# Patient Record
Sex: Female | Born: 1995
Health system: Southern US, Community
[De-identification: ages and names within clinical notes are randomized; demographics above are authoritative.]

## PROBLEM LIST (undated history)

## (undated) DIAGNOSIS — Z5189 Encounter for other specified aftercare: Secondary | ICD-10-CM

## (undated) DIAGNOSIS — R87629 Unspecified abnormal cytological findings in specimens from vagina: Secondary | ICD-10-CM

---

## 2017-06-18 ENCOUNTER — Encounter (HOSPITAL_BASED_OUTPATIENT_CLINIC_OR_DEPARTMENT_OTHER): Payer: Self-pay

## 2017-06-18 ENCOUNTER — Emergency Department (HOSPITAL_BASED_OUTPATIENT_CLINIC_OR_DEPARTMENT_OTHER)
Admission: EM | Admit: 2017-06-18 | Discharge: 2017-06-18 | Disposition: A | Discharge status: Home / Self Care | Payer: Self-pay | Attending: Emergency Medicine | Admitting: Emergency Medicine

## 2017-06-18 DIAGNOSIS — B9689 Other specified bacterial agents as the cause of diseases classified elsewhere: Secondary | ICD-10-CM

## 2017-06-18 DIAGNOSIS — N898 Other specified noninflammatory disorders of vagina: Secondary | ICD-10-CM

## 2017-06-18 DIAGNOSIS — N760 Acute vaginitis: Secondary | ICD-10-CM | POA: Insufficient documentation

## 2017-06-18 DIAGNOSIS — R1033 Periumbilical pain: Secondary | ICD-10-CM | POA: Insufficient documentation

## 2017-06-18 LAB — URINALYSIS, ROUTINE W REFLEX MICROSCOPIC
BILIRUBIN URINE: NEGATIVE
Glucose, UA: NEGATIVE mg/dL
Hgb urine dipstick: NEGATIVE
Ketones, ur: 15 mg/dL — AB
NITRITE: NEGATIVE
Protein, ur: NEGATIVE mg/dL
SPECIFIC GRAVITY, URINE: 1.028 (ref 1.005–1.030)
pH: 6.5 (ref 5.0–8.0)

## 2017-06-18 LAB — WET PREP, GENITAL
Sperm: NONE SEEN
Trich, Wet Prep: NONE SEEN
YEAST WET PREP: NONE SEEN

## 2017-06-18 LAB — COMPREHENSIVE METABOLIC PANEL
ALT: 11 U/L — ABNORMAL LOW (ref 14–54)
AST: 16 U/L (ref 15–41)
Albumin: 4.2 g/dL (ref 3.5–5.0)
Alkaline Phosphatase: 61 U/L (ref 38–126)
Anion gap: 7 (ref 5–15)
BILIRUBIN TOTAL: 0.3 mg/dL (ref 0.3–1.2)
BUN: 14 mg/dL (ref 6–20)
CO2: 26 mmol/L (ref 22–32)
CREATININE: 0.84 mg/dL (ref 0.44–1.00)
Calcium: 9.4 mg/dL (ref 8.9–10.3)
Chloride: 106 mmol/L (ref 101–111)
GFR calc Af Amer: >60 mL/min (ref >60)
Glucose, Bld: 89 mg/dL (ref 65–99)
POTASSIUM: 4.2 mmol/L (ref 3.5–5.1)
Sodium: 139 mmol/L (ref 135–145)
Total Protein: 8.6 g/dL — ABNORMAL HIGH (ref 6.5–8.1)

## 2017-06-18 LAB — URINALYSIS, MICROSCOPIC (REFLEX)

## 2017-06-18 LAB — PREGNANCY, URINE: PREG TEST UR: NEGATIVE

## 2017-06-18 MED ORDER — CEFTRIAXONE SODIUM 250 MG IJ SOLR
250.0000 mg | Freq: Once | INTRAMUSCULAR | Status: AC
Start: 2017-06-18 — End: 2017-06-18
  Administered 2017-06-18: 250 mg via INTRAMUSCULAR
  Filled 2017-06-18: qty 250

## 2017-06-18 MED ORDER — METRONIDAZOLE 500 MG PO TABS: 500.0000 mg | ORAL_TABLET | Freq: Two times a day (BID) | ORAL | 0 refills | Status: DC

## 2017-06-18 MED ORDER — AZITHROMYCIN 250 MG PO TABS
1000.0000 mg | ORAL_TABLET | Freq: Once | ORAL | Status: AC
  Administered 2017-06-18: 1000 mg via ORAL
  Filled 2017-06-18: qty 4

## 2017-06-18 NOTE — ED Triage Notes (Signed)
C/o abd pain x 2 days-denies n/v/d and vaginal d/c-pos urinary freq-NAD-steady gait

## 2017-06-18 NOTE — ED Provider Notes (Signed)
[facility] Provider Note   CSN: [id number] Arrival date & time: 06/18/17  1524     History   Chief Complaint Chief Complaint  Patient presents with  . Abdominal Pain    HPI Taylor Chang is a 21 y.o. female who presents to the emergency department with intermittent, sharp periumbilical pain that began 2 days ago. She reports associated urinary frequency that began within the last week and and vaginal discharge that has been ongoing for sometime. She denies nausea, vomiting, or diarrhea. No fever or chills. She reports that her son is in daycare and has had diarrhea for the last 2 days. No other sick contacts.  She reports that she was treated 2 months ago for chlamydia. She states that she is still currently active with the same female sexual partner, but states he was also treated at that time. She is uncertain if their relationship is monogamous. No pertinent past medical history. No daily medications.  The history is provided by the patient. No language interpreter was used.    History reviewed. No pertinent past medical history.  There are no active problems to display for this patient.   History reviewed. No pertinent surgical history.  OB History    No data available       Home Medications    Prior to Admission medications   Medication Sig Start Date End Date Taking? Authorizing Provider  metroNIDAZOLE (FLAGYL) 500 MG tablet Take 1 tablet (500 mg total) by mouth 2 (two) times daily. 06/18/17   Donisha Hoch A, PA-C    Family History No family history on file.  Social History Social History  Substance Use Topics  . Smoking status: Never Smoker  . Smokeless tobacco: Never Used  . Alcohol use No     Allergies   Patient has no known allergies.   Review of Systems Review of Systems  Constitutional: Negative for activity change, chills and fever.  Respiratory: Negative for shortness of breath.   Cardiovascular: Negative for chest pain.    Gastrointestinal: Positive for abdominal pain. Negative for diarrhea, nausea and vomiting.  Genitourinary: Positive for frequency and vaginal discharge. Negative for dysuria, urgency and vaginal pain.  Musculoskeletal: Negative for back pain.  Skin: Negative for rash.     Physical Exam Updated Vital Signs BP 114/71 (BP Location: Right Arm)   Pulse 74   Temp 99.2 F (37.3 C) (Oral)   Resp 16   Ht 5\' 6"  (1.676 m)   Wt 69.9 kg (154 lb)   LMP 05/30/2017   SpO2 96%   BMI 24.86 kg/m   Physical Exam  Constitutional: She appears well-developed and well-nourished. No distress.  HENT:  Head: Normocephalic.  Eyes: Conjunctivae are normal.  Neck: Neck supple.  Cardiovascular: Normal rate, regular rhythm and normal heart sounds.  Exam reveals no gallop and no friction rub.   No murmur heard. Pulmonary/Chest: Effort normal and breath sounds normal. No respiratory distress. She has no wheezes. She has no rales.  Abdominal: Soft. Bowel sounds are normal. She exhibits no distension. There is tenderness. There is no rebound and no guarding.  Mild periumbilical tenderness to palpation. No rebound or guarding.  Genitourinary:  Genitourinary Comments: Chaperoned exam. Thick yellow discharge noted in the vaginal vault. No cervical motion tenderness. No adnexal tenderness. The uterus appears normal.   Neurological: She is alert.  Skin: Skin is warm. No rash noted. She is not diaphoretic.  Psychiatric: Her behavior is normal.  Nursing note and vitals reviewed.  ED Treatments / Results  Labs (all labs ordered are listed, but only abnormal results are displayed) Labs Reviewed  WET PREP, GENITAL - Abnormal; Notable for the following:       Result Value   Clue Cells Wet Prep HPF POC PRESENT (*)    WBC, Wet Prep HPF POC MANY (*)    All other components within normal limits  URINALYSIS, ROUTINE W REFLEX MICROSCOPIC - Abnormal; Notable for the following:    Ketones, ur 15 (*)    Leukocytes, UA  SMALL (*)    All other components within normal limits  COMPREHENSIVE METABOLIC PANEL - Abnormal; Notable for the following:    Total Protein 8.6 (*)    ALT 11 (*)    All other components within normal limits  URINALYSIS, MICROSCOPIC (REFLEX) - Abnormal; Notable for the following:    Bacteria, UA MANY (*)    Squamous Epithelial / LPF 0-5 (*)    All other components within normal limits  PREGNANCY, URINE  HIV ANTIBODY (ROUTINE TESTING)  RPR  GC[facility]    EKG  EKG Interpretation None       Radiology No results found.  Procedures Procedures (including critical care time)  Medications Ordered in ED Medications  azithromycin (ZITHROMAX) tablet 1,000 mg (1,000 mg Oral Given 06/18/17 1820)  cefTRIAXone (ROCEPHIN) injection 250 mg (250 mg Intramuscular Given 06/18/17 1820)     Initial Impression / Assessment and Plan / ED Course  I have reviewed the triage vital signs and the nursing notes.  Pertinent labs & imaging results that were available during my care of the patient were reviewed by me and considered in my medical decision making (see chart for details).     Patient to be discharged with instructions to follow up with OBGYN. Discussed importance of using protection when sexually active. Pt understands that they have GC/Chlamydia cultures pending and that they will need to inform all sexual partners if results return positive. Pt has been treated prophylacticly with azithromycin and rocephin due to pts history, pelvic exam, and wet prep with increased WBCs. Pt not concerning for PID because hemodynamically stable and no cervical motion tenderness on pelvic exam. Pt has also been treated with flagyl for Bacterial Vaginosis. Pt has been advised to not drink alcohol while on this medication. Strict return precautions given. NAD. The patient is safe for d/c at this time.   Final Clinical Impressions(s) / ED Diagnoses   Final diagnoses:    Bacterial vaginosis  Vaginal discharge  Periumbilical abdominal pain    New Prescriptions Discharge Medication List as of 06/18/2017  6:17 PM    START taking these medications   Details  metroNIDAZOLE (FLAGYL) 500 MG tablet Take 1 tablet (500 mg total) by mouth 2 (two) times daily., Starting Fri 06/18/2017, Print         Daishon Chui A, PA-C 06/19/17 Margit [id number]    Isaacs, Cameron, MD 06/19/17 [phone]

## 2017-06-18 NOTE — ED Notes (Signed)
ED Provider at bedside. 

## 2017-06-18 NOTE — Discharge Instructions (Signed)
Please take metronidazole once every 12 hours for the next 7 days to be treated for bacterial vaginosis. Please do not drink any alcohol while your taking this medication because it can cause severe vomiting. Please avoid having sex for the next week while you are taking antibiotics.   Several other labs are pending including gonorrhea, chlamydia, syphilis, and HIV. If any of the results are positive, the lab will call you at the number you provided during registration. You have arty been treated for gonorrhea and chlamydia. If any of these results come back positive, please let your sexual partner know that you had a positive test result.

## 2017-06-19 LAB — RPR: RPR Ser Ql: NONREACTIVE

## 2017-06-21 LAB — GC/CHLAMYDIA PROBE AMP (~~LOC~~) NOT AT ARMC
Chlamydia: NEGATIVE
Neisseria Gonorrhea: POSITIVE — AB

## 2017-06-22 LAB — HIV ANTIBODY (ROUTINE TESTING W REFLEX): HIV SCREEN 4TH GENERATION: NONREACTIVE

## 2017-07-22 ENCOUNTER — Emergency Department (HOSPITAL_BASED_OUTPATIENT_CLINIC_OR_DEPARTMENT_OTHER)
Admission: EM | Admit: 2017-07-22 | Discharge: 2017-07-22 | Disposition: A | Discharge status: Home / Self Care | Payer: Self-pay | Attending: Emergency Medicine | Admitting: Emergency Medicine

## 2017-07-22 ENCOUNTER — Encounter (HOSPITAL_BASED_OUTPATIENT_CLINIC_OR_DEPARTMENT_OTHER): Payer: Self-pay | Admitting: *Deleted

## 2017-07-22 DIAGNOSIS — Z5321 Procedure and treatment not carried out due to patient leaving prior to being seen by health care provider: Secondary | ICD-10-CM | POA: Insufficient documentation

## 2017-07-22 DIAGNOSIS — N644 Mastodynia: Secondary | ICD-10-CM | POA: Insufficient documentation

## 2017-07-22 LAB — CBC WITH DIFFERENTIAL/PLATELET
BASOS ABS: 0 10*3/uL (ref 0.0–0.1)
BASOS PCT: 1 %
EOS PCT: 2 %
Eosinophils Absolute: 0.1 10*3/uL (ref 0.0–0.7)
HCT: 36.5 % (ref 36.0–46.0)
Hemoglobin: 12.2 g/dL (ref 12.0–15.0)
Lymphocytes Relative: 32 %
Lymphs Abs: 2.6 10*3/uL (ref 0.7–4.0)
MCH: 30.2 pg (ref 26.0–34.0)
MCHC: 33.4 g/dL (ref 30.0–36.0)
MCV: 90.3 fL (ref 78.0–100.0)
MONO ABS: 1 10*3/uL (ref 0.1–1.0)
Monocytes Relative: 13 %
Neutro Abs: 4.4 10*3/uL (ref 1.7–7.7)
Neutrophils Relative %: 52 %
Platelets: 303 10*3/uL (ref 150–400)
RBC: 4.04 MIL/uL (ref 3.87–5.11)
RDW: 13.5 % (ref 11.5–15.5)
WBC: 8.2 10*3/uL (ref 4.0–10.5)

## 2017-07-22 LAB — URINALYSIS, MICROSCOPIC (REFLEX): RBC / HPF: NONE SEEN RBC/hpf (ref 0–5)

## 2017-07-22 LAB — URINALYSIS, ROUTINE W REFLEX MICROSCOPIC
BILIRUBIN URINE: NEGATIVE
GLUCOSE, UA: NEGATIVE mg/dL
Hgb urine dipstick: NEGATIVE
KETONES UR: NEGATIVE mg/dL
NITRITE: NEGATIVE
PH: 6 (ref 5.0–8.0)
Protein, ur: NEGATIVE mg/dL

## 2017-07-22 LAB — BASIC METABOLIC PANEL
ANION GAP: 7 (ref 5–15)
BUN: 13 mg/dL (ref 6–20)
CALCIUM: 9.4 mg/dL (ref 8.9–10.3)
CO2: 25 mmol/L (ref 22–32)
Chloride: 105 mmol/L (ref 101–111)
Creatinine, Ser: 0.87 mg/dL (ref 0.44–1.00)
Glucose, Bld: 82 mg/dL (ref 65–99)
POTASSIUM: 4.2 mmol/L (ref 3.5–5.1)
SODIUM: 137 mmol/L (ref 135–145)

## 2017-07-22 LAB — PREGNANCY, URINE: Preg Test, Ur: NEGATIVE

## 2017-07-22 NOTE — ED Triage Notes (Signed)
Tender breast. Abdominal fullness.

## 2017-07-22 NOTE — ED Notes (Signed)
Pt informed of wait time and states that she cannot wait any longer.  She was given return precautions and denies any further need at this time.

## 2017-07-28 ENCOUNTER — Inpatient Hospital Stay (HOSPITAL_COMMUNITY)
Admission: AD | Admit: 2017-07-28 | Discharge: 2017-07-28 | Disposition: A | Discharge status: Home / Self Care | Payer: Self-pay | Source: Ambulatory Visit | Attending: Family Medicine | Admitting: Family Medicine

## 2017-07-28 ENCOUNTER — Encounter (HOSPITAL_COMMUNITY): Payer: Self-pay | Admitting: *Deleted

## 2017-07-28 DIAGNOSIS — R109 Unspecified abdominal pain: Secondary | ICD-10-CM

## 2017-07-28 DIAGNOSIS — N938 Other specified abnormal uterine and vaginal bleeding: Secondary | ICD-10-CM | POA: Insufficient documentation

## 2017-07-28 DIAGNOSIS — R1030 Lower abdominal pain, unspecified: Secondary | ICD-10-CM | POA: Insufficient documentation

## 2017-07-28 LAB — URINALYSIS, ROUTINE W REFLEX MICROSCOPIC
BACTERIA UA: NONE SEEN
BILIRUBIN URINE: NEGATIVE
Glucose, UA: NEGATIVE mg/dL
Hgb urine dipstick: NEGATIVE
KETONES UR: NEGATIVE mg/dL
Nitrite: NEGATIVE
Protein, ur: NEGATIVE mg/dL
Specific Gravity, Urine: 1.021 (ref 1.005–1.030)
pH: 6 (ref 5.0–8.0)

## 2017-07-28 LAB — POCT PREGNANCY, URINE: PREG TEST UR: NEGATIVE

## 2017-07-28 LAB — HCG, SERUM, QUALITATIVE: PREG SERUM: NEGATIVE

## 2017-07-28 NOTE — MAU Note (Signed)
Pt. Discharged to home, hard copy signature obtained (computer e-signature "disconnected").  Hard copy signature placed with paper records.

## 2017-07-28 NOTE — Discharge Instructions (Signed)
Dysfunctional Uterine Bleeding Dysfunctional uterine bleeding is abnormal bleeding from the uterus. Dysfunctional uterine bleeding includes:  A period that comes earlier or later than usual.  A period that is lighter, heavier, or has blood clots.  Bleeding between periods.  Skipping one or more periods.  Bleeding after sexual intercourse.  Bleeding after menopause.  Follow these instructions at home: Pay attention to any changes in your symptoms. Follow these instructions to help with your condition: Eating and drinking  Eat well-balanced meals. Include foods that are high in iron, such as liver, meat, shellfish, green leafy vegetables, and eggs.  If you become constipated: ? Drink plenty of water. ? Eat fruits and vegetables that are high in water and fiber, such as spinach, carrots, raspberries, apples, and mango. Medicines  Take over-the-counter and prescription medicines only as told by your health care provider.  Do not change medicines without talking with your health care provider.  Aspirin or medicines that contain aspirin may make the bleeding worse. Do not take those medicines: ? During the week before your period. ? During your period.  If you were prescribed iron pills, take them as told by your health care provider. Iron pills help to replace iron that your body loses because of this condition. Activity  If you need to change your sanitary pad or tampon more than one time every 2 hours: ? Lie in bed with your feet raised (elevated). ? Place a cold pack on your lower abdomen. ? Rest as much as possible until the bleeding stops or slows down.  Do not try to lose weight until the bleeding has stopped and your blood iron level is back to normal. Other Instructions  For two months, write down: ? When your period starts. ? When your period ends. ? When any abnormal bleeding occurs. ? What problems you notice.  Keep all follow up visits as told by your health  care provider. This is important. Contact a health care provider if:  You get light-headed or weak.  You have nausea and vomiting.  You cannot eat or drink without vomiting.  You feel dizzy or have diarrhea while you are taking medicines.  You are taking birth control pills or hormones, and you want to change them or stop taking them. Get help right away if:  You develop a fever or chills.  You need to change your sanitary pad or tampon more than one time per hour.  Your bleeding becomes heavier, or your flow contains clots more often.  You develop pain in your abdomen.  You lose consciousness.  You develop a rash. This information is not intended to replace advice given to you by your health care provider. Make sure you discuss any questions you have with your health care provider. Document Released: 10/30/2000 Document Revised: 04/09/2016 Document Reviewed: 01/28/2015 Elsevier Interactive Patient Education  2018 [employer]  OB/GYN Providers [facility]    [facility]  [phone]     Phone: [phone]          [facility]                      [facility]  @[place]     Phone: [phone]  Phone: [phone]         [facility]     Phone: [phone]  Phone: [phone]  [facility] @ [place]                hone: [phone]  Phone: [phone]         [facility]                                 Phone: [phone]           [facility]                Phone: [phone]  [facility]   [address] [place])         Phone: [phone] [facility]   Phone: [phone]

## 2017-07-28 NOTE — MAU Note (Signed)
Pt reports her period last month was short and light. States last week she had a few days of spotting also. Also reports lower abd pain and breast tenderness for the last few days. Home preg test negative.

## 2017-07-28 NOTE — MAU Provider Note (Signed)
Chief Complaint:  Abdominal Pain and Vaginal Bleeding   First Provider Initiated Contact with Patient 07/28/17 1134     HPI: Taylor Chang is a 21 y.o. G2P1011 who presents to maternity admissions reporting lower abdominal pain and breast tenderness.  Home pregnancy test was negative but pt states "I know my body and I know I am pregnant and I want a blood test".  Period is not late.  Does not want any STD testing. She reports vaginal bleeding, but no vaginal itching/burning, urinary symptoms, h/a, dizziness, n/v, or fever/chills.    Abdominal Pain  This is a new problem. The current episode started in the past 7 days. The onset quality is gradual. The problem occurs intermittently. The problem has been unchanged. The pain is located in the suprapubic region. The abdominal pain does not radiate. Pertinent negatives include no constipation, diarrhea, fever, headaches or myalgias. The pain is relieved by nothing.  Vaginal Bleeding  The patient's primary symptoms include vaginal bleeding. The patient's pertinent negatives include no genital itching, genital lesions, genital odor, missed menses or pelvic pain. Associated symptoms include abdominal pain. Pertinent negatives include no constipation, diarrhea, fever or headaches. The vaginal discharge was bloody. The vaginal bleeding is spotting. She has not been passing clots. She has not been passing tissue. Nothing aggravates the symptoms. She has tried nothing for the symptoms. She is sexually active.    RN Note: Pt reports her period last month was short and light. States last week she had a few days of spotting also. Also reports lower abd pain and breast tenderness for the last few days. Home preg test negative.   Past Medical History: History reviewed. No pertinent past medical history.  Past obstetric history: OB History  Gravida Para Term Preterm AB Living  2 1 1   1 1   SAB TAB Ectopic Multiple Live Births          1    # Outcome Date  GA Lbr Len/2nd Weight Sex Delivery Anes PTL Lv  2 AB 04/2016          1 Term 05/18/14    M Vag-Spont   LIV      Past Surgical History: History reviewed. No pertinent surgical history.  Family History: History reviewed. No pertinent family history.  Social History: Social History  Substance Use Topics  . Smoking status: Never Smoker  . Smokeless tobacco: Never Used  . Alcohol use No    Allergies: No Known Allergies  Meds:  Prescriptions Prior to Admission  Medication Sig Dispense Refill Last Dose  . metroNIDAZOLE (FLAGYL) 500 MG tablet Take 1 tablet (500 mg total) by mouth 2 (two) times daily. 14 tablet 0     I have reviewed patient's Past Medical Hx, Surgical Hx, Family Hx, Social Hx, medications and allergies.  ROS:  Review of Systems  Constitutional: Negative for fever.  Gastrointestinal: Positive for abdominal pain. Negative for constipation and diarrhea.  Genitourinary: Positive for vaginal bleeding. Negative for missed menses and pelvic pain.  Musculoskeletal: Negative for myalgias.  Neurological: Negative for headaches.   Other systems negative     Physical Exam  Patient Vitals for the past 24 hrs:  BP Temp Temp src Pulse Resp SpO2 Height Weight  07/28/17 1053 126/74 97.8 F (36.6 C) Oral 81 17 100 % 5\' 5"  (1.651 m) 153 lb (69.4 kg)   Constitutional: Well-developed, well-nourished female in no acute distress.  Cardiovascular: normal rate and rhythm, no ectopy audible, S1 & S2 heard,  no murmur Respiratory: normal effort, no distress. Lungs CTAB with no wheezes or crackles GI: Abd soft, non-tender.  Nondistended.  No rebound, No guarding.  Bowel Sounds audible  MS: Extremities nontender, no edema, normal ROM Neurologic: Alert and oriented x 4.   Grossly nonfocal. GU: Neg CVAT. Skin:  Warm and Dry Psych:  Affect appropriate.  PELVIC EXAM: Bimanual exam: Cervix firm, anterior, neg CMT, uterus nontender, nonenlarged, adnexa without tenderness, enlargement,  or mass    Results for orders placed or performed during the hospital encounter of 07/28/17 (from the past 72 hour(s))  Urinalysis, Routine w reflex microscopic     Status: Abnormal   Collection Time: 07/28/17 10:51 AM  Result Value Ref Range   Color, Urine YELLOW YELLOW   APPearance CLEAR CLEAR   Specific Gravity, Urine 1.021 1.005 - 1.030   pH 6.0 5.0 - 8.0   Glucose, UA NEGATIVE NEGATIVE mg/dL   Hgb urine dipstick NEGATIVE NEGATIVE   Bilirubin Urine NEGATIVE NEGATIVE   Ketones, ur NEGATIVE NEGATIVE mg/dL   Protein, ur NEGATIVE NEGATIVE mg/dL   Nitrite NEGATIVE NEGATIVE   Leukocytes, UA TRACE (A) NEGATIVE   RBC / HPF 0-5 0 - 5 RBC/hpf   WBC, UA 0-5 0 - 5 WBC/hpf   Bacteria, UA NONE SEEN NONE SEEN   Squamous Epithelial / LPF 0-5 (A) NONE SEEN   Mucus PRESENT   Pregnancy, urine POC     Status: None   Collection Time: 07/28/17 11:21 AM  Result Value Ref Range   Preg Test, Ur NEGATIVE NEGATIVE    Comment:        THE SENSITIVITY OF THIS METHODOLOGY IS >24 mIU/mL   hCG, serum, qualitative     Status: None   Collection Time: 07/28/17 12:00 PM  Result Value Ref Range   Preg, Serum NEGATIVE NEGATIVE    Comment:        THE SENSITIVITY OF THIS METHODOLOGY IS >10 mIU/mL.      Imaging:  No results found.  MAU Course/MDM: I have ordered labs as follows:  Qualitative HCG which was negative, declined cultures Imaging ordered: none Results reviewed.  Discussed issues with irregular menses, anovulation.  Adamant that she has never had an abnormal period and she thinks she his pregnant. Discussed negative blood test.  Pt stable at time of discharge.  Assessment: Abdominal cramping - Plan: Discharge patient, CANCELED: Discharge patient  Dysfunctional uterine bleeding - Plan: Discharge patient   Plan: Discharge home Recommend track cycles, use contraception if she does not want to be pregnant.  List of OB/GYN providers given   Encouraged to return here or to other Urgent  Care/ED if she develops worsening of symptoms, increase in pain, fever, or other concerning symptoms.   Wynelle Bourgeois CNM, MSN Certified Nurse-Midwife 07/28/2017 11:35 AM

## 2017-11-16 HISTORY — PX: WISDOM TOOTH EXTRACTION: SHX21

## 2017-12-20 ENCOUNTER — Emergency Department (HOSPITAL_BASED_OUTPATIENT_CLINIC_OR_DEPARTMENT_OTHER)
Admission: EM | Admit: 2017-12-20 | Discharge: 2017-12-20 | Disposition: A | Discharge status: Home / Self Care | Payer: Medicaid Other | Attending: Emergency Medicine | Admitting: Emergency Medicine

## 2017-12-20 ENCOUNTER — Other Ambulatory Visit: Payer: Self-pay

## 2017-12-20 ENCOUNTER — Emergency Department (HOSPITAL_BASED_OUTPATIENT_CLINIC_OR_DEPARTMENT_OTHER): Payer: Medicaid Other

## 2017-12-20 DIAGNOSIS — B9689 Other specified bacterial agents as the cause of diseases classified elsewhere: Secondary | ICD-10-CM

## 2017-12-20 DIAGNOSIS — R1030 Lower abdominal pain, unspecified: Secondary | ICD-10-CM | POA: Diagnosis not present

## 2017-12-20 DIAGNOSIS — N760 Acute vaginitis: Secondary | ICD-10-CM | POA: Insufficient documentation

## 2017-12-20 DIAGNOSIS — R300 Dysuria: Secondary | ICD-10-CM | POA: Diagnosis present

## 2017-12-20 DIAGNOSIS — Z3201 Encounter for pregnancy test, result positive: Secondary | ICD-10-CM | POA: Insufficient documentation

## 2017-12-20 DIAGNOSIS — N938 Other specified abnormal uterine and vaginal bleeding: Secondary | ICD-10-CM | POA: Insufficient documentation

## 2017-12-20 DIAGNOSIS — Z5321 Procedure and treatment not carried out due to patient leaving prior to being seen by health care provider: Secondary | ICD-10-CM

## 2017-12-20 LAB — URINALYSIS, ROUTINE W REFLEX MICROSCOPIC
Bilirubin Urine: NEGATIVE
Glucose, UA: NEGATIVE mg/dL
HGB URINE DIPSTICK: NEGATIVE
Ketones, ur: NEGATIVE mg/dL
Nitrite: NEGATIVE
Protein, ur: NEGATIVE mg/dL
SPECIFIC GRAVITY, URINE: 1.02 (ref 1.005–1.030)
pH: 7.5 (ref 5.0–8.0)

## 2017-12-20 LAB — WET PREP, GENITAL
Sperm: NONE SEEN
Trich, Wet Prep: NONE SEEN
Yeast Wet Prep HPF POC: NONE SEEN

## 2017-12-20 LAB — URINALYSIS, MICROSCOPIC (REFLEX)

## 2017-12-20 LAB — PREGNANCY, URINE: PREG TEST UR: POSITIVE — AB

## 2017-12-20 NOTE — ED Provider Notes (Signed)
[facility] EMERGENCY DEPARTMENT Provider Note   CSN: [id number] Arrival date & time: 12/20/17  1353     History   Chief Complaint Chief Complaint  Patient presents with  . Dysuria    HPI Taylor Chang is a 22 y.o. female presents today with chief complaint acute onset, intermittent dysuria for 2 days.  She also notes urinary urgency and frequency for 1 week.  She endorses intermittent sharp suprapubic pain especially with urination.  She denies radiation of pain.  She denies fevers, chills, chest pain, shortness of breath, nausea, vomiting, hematuria, melena, hematochezia, constipation, diarrhea.  She denies vaginal itching, bleeding, or discharge.  She states that her last menstrual cycle began November 16, 2017 and lasted for 3 days and was normal for her.  She is currently sexually active with one female partner and does not always use protection.  She did take a pregnancy test at home 2 days ago which was positive.  The history is provided by the patient.    No past medical history on file.  There are no active problems to display for this patient.   No past surgical history on file.  OB History    Gravida Para Term Preterm AB Living   2 1 1   1 1    SAB TAB Ectopic Multiple Live Births           1       Home Medications    Prior to Admission medications   Not on File    Family History No family history on file.  Social History Social History   Tobacco Use  . Smoking status: Never Smoker  . Smokeless tobacco: Never Used  Substance Use Topics  . Alcohol use: No  . Drug use: No     Allergies   Patient has no known allergies.   Review of Systems Review of Systems  Constitutional: Negative for chills and fever.  Respiratory: Negative for shortness of breath.   Cardiovascular: Negative for chest pain.  Gastrointestinal: Positive for abdominal pain. Negative for constipation, diarrhea, nausea and vomiting.  Genitourinary: Positive for  dysuria, frequency and urgency. Negative for flank pain, hematuria, vaginal bleeding, vaginal discharge and vaginal pain.  Musculoskeletal: Negative for back pain.  All other systems reviewed and are negative.    Physical Exam Updated Vital Signs BP (!) 142/69 (BP Location: Right Arm)   Pulse 98   Temp 99.8 F (37.7 C)   Resp 16   Ht 5\' 5"  (1.651 m)   Wt 72.6 kg (160 lb)   SpO2 100%   BMI 26.63 kg/m   Physical Exam  Constitutional: She appears well-developed and well-nourished. No distress.  Resting comfortably in bed, in no apparent distress  HENT:  Head: Normocephalic and atraumatic.  Eyes: Conjunctivae are normal. Right eye exhibits no discharge. Left eye exhibits no discharge.  Neck: Normal range of motion. Neck supple. No JVD present. No tracheal deviation present.  Cardiovascular: Normal rate, regular rhythm and normal heart sounds.  Pulmonary/Chest: Effort normal and breath sounds normal.  Abdominal: Soft. Bowel sounds are normal. She exhibits no distension. There is tenderness. There is no guarding.  Mild suprapubic tenderness, Murphy sign absent, Rovsing's absent, no CVA tenderness  Genitourinary:  Genitourinary Comments: Examination performed in the presence of chaperone.  No  lesions to the external genitalia.  Cervix is pink with centrally friable appearance near the os. Cervical os is closed. Small amount of bleeding noted during examination. Moderate amount of white-yellow  discharge in the vaginal vault.  Mild cervical motion tenderness and bilateral adnexal tenderness  Musculoskeletal: Normal range of motion. She exhibits no edema.  Neurological: She is alert.  Skin: Skin is warm and dry. No erythema.  Psychiatric: She has a normal mood and affect. Her behavior is normal.  Nursing note and vitals reviewed.    ED Treatments / Results  Labs (all labs ordered are listed, but only abnormal results are displayed) Labs Reviewed  WET PREP, GENITAL - Abnormal;  Notable for the following components:      Result Value   Clue Cells Wet Prep HPF POC PRESENT (*)    WBC, Wet Prep HPF POC MANY (*)    All other components within normal limits  URINALYSIS, ROUTINE W REFLEX MICROSCOPIC - Abnormal; Notable for the following components:   Leukocytes, UA TRACE (*)    All other components within normal limits  PREGNANCY, URINE - Abnormal; Notable for the following components:   Preg Test, Ur POSITIVE (*)    All other components within normal limits  URINALYSIS, MICROSCOPIC (REFLEX) - Abnormal; Notable for the following components:   Bacteria, UA RARE (*)    Squamous Epithelial / LPF 0-5 (*)    All other components within normal limits  URINE CULTURE  RPR  HIV ANTIBODY (ROUTINE TESTING)  GC/CHLAMYDIA PROBE AMP ([place]) NOT AT [facility]    EKG  EKG Interpretation None       Radiology US Ob Comp < 14 Wks  Result Date: 12/20/2017 CLINICAL DATA:  Suprapubic pain with vaginal bleeding EXAM: OBSTETRIC <14 WK [place] AND TRANSVAGINAL OB US TECHNIQUE: Both transabdominal and transvaginal ultrasound examinations were performed for complete evaluation of the gestation as well as the maternal uterus, adnexal regions, and pelvic cul-de-sac. Transvaginal technique was performed to assess early pregnancy. COMPARISON:  None. FINDINGS: Intrauterine gestational sac: Single intrauterine gestational sac Yolk sac:  Visible Embryo:  Not visible Cardiac Activity: Not visible MSD: 4.9 mm   5 w   2 d Subchorionic hemorrhage:  None visualized. Maternal uterus/adnexae: Ovaries are within normal limits. The right ovary measures 2.3 x 3.2 x 2.4 cm and contains a probable corpus luteal cyst. The left ovary measures 2.8 by 3.8 by 3 cm. Small amount of free fluid in the pelvis IMPRESSION: 1. Single intrauterine gestational sac with visible yolk sac but no embryo. Consider ultrasound follow-up in 10-14 days to confirm viability. 2. Small amount of free fluid in the pelvis Electronically Signed    By: Clotile Pang M.D.   On: 12/20/2017 18:03   US Ob Transvaginal  Result Date: 12/20/2017 CLINICAL DATA:  Suprapubic pain with vaginal bleeding EXAM: OBSTETRIC <14 WK [place] AND TRANSVAGINAL OB US TECHNIQUE: Both transabdominal and transvaginal ultrasound examinations were performed for complete evaluation of the gestation as well as the maternal uterus, adnexal regions, and pelvic cul-de-sac. Transvaginal technique was performed to assess early pregnancy. COMPARISON:  None. FINDINGS: Intrauterine gestational sac: Single intrauterine gestational sac Yolk sac:  Visible Embryo:  Not visible Cardiac Activity: Not visible MSD: 4.9 mm   5 w   2 d Subchorionic hemorrhage:  None visualized. Maternal uterus/adnexae: Ovaries are within normal limits. The right ovary measures 2.3 x 3.2 x 2.4 cm and contains a probable corpus luteal cyst. The left ovary measures 2.8 by 3.8 by 3 cm. Small amount of free fluid in the pelvis IMPRESSION: 1. Single intrauterine gestational sac with visible yolk sac but no embryo. Consider ultrasound follow-up in 10-14 days to confirm  viability. 2. Small amount of free fluid in the pelvis Electronically Signed   By: Mirian Pang M.D.   On: 12/20/2017 18:03    Procedures Procedures (including critical care time)  Medications Ordered in ED Medications - No data to display   Initial Impression / Assessment and Plan / ED Course  I have reviewed the triage vital signs and the nursing notes.  Pertinent labs & imaging results that were available during my care of the patient were reviewed by me and considered in my medical decision making (see chart for details).    Patient with urinary symptoms for 1 week.  Afebrile, vital signs are stable.  Mild suprapubic tenderness on examination, however more concerning is cervix appears mildly friable and had a small amount of bright red blood on examination.  She had mild cervical motion tenderness as well.  UA is not entirely consistent with UTI,  we will culture.  She did have a positive urine pregnancy test which I informed her and recommended pelvic ultrasound for further evaluation and rule out of ectopic pregnancy.  Wet prep shows findings consistent with BV which could explain her dysuria.  Alternatively, patient may have a mild case of PID although I have a lower suspicion of this.  Ultrasound shows a single intrauterine gestational sac with a visible yolk sac but no embryo and a small amount of free fluid in the pelvis.  Radiology recommends follow-up ultrasound in 10-14 days to confirm viability.  No evidence of TOA or ovarian torsion.  I attempted to inform the patient of her results but the patient had eloped from the emergency department without my knowledge.  Final Clinical Impressions(s) / ED Diagnoses   Final diagnoses:  Eloped from emergency department  Dysuria  Bacterial vaginosis  Positive urine pregnancy test    ED Discharge Orders    None       Jeanie Sewer, PA-C 12/21/17 1229    Vanetta Mulders, MD 12/21/17 1525

## 2017-12-20 NOTE — ED Notes (Signed)
Patient stated that she felt pressure on her bladder when she urinates.

## 2017-12-20 NOTE — ED Notes (Signed)
Pt states she can not wait any longer , pt seen leaving

## 2017-12-20 NOTE — ED Notes (Signed)
ED Provider at bedside. 

## 2017-12-20 NOTE — ED Triage Notes (Signed)
Pt c/o dysuria x 1 week, denies vaginal discharge

## 2017-12-21 LAB — HIV ANTIBODY (ROUTINE TESTING W REFLEX): HIV Screen 4th Generation wRfx: NONREACTIVE

## 2017-12-21 LAB — URINE CULTURE: Culture: NO GROWTH

## 2017-12-21 LAB — GC/CHLAMYDIA PROBE AMP (~~LOC~~) NOT AT ARMC
Chlamydia: NEGATIVE
Neisseria Gonorrhea: NEGATIVE

## 2017-12-21 LAB — RPR: RPR Ser Ql: NONREACTIVE

## 2018-04-30 ENCOUNTER — Other Ambulatory Visit: Payer: Self-pay

## 2018-04-30 ENCOUNTER — Encounter (HOSPITAL_BASED_OUTPATIENT_CLINIC_OR_DEPARTMENT_OTHER): Payer: Self-pay | Admitting: *Deleted

## 2018-04-30 DIAGNOSIS — Z3A00 Weeks of gestation of pregnancy not specified: Secondary | ICD-10-CM | POA: Diagnosis not present

## 2018-04-30 DIAGNOSIS — K0889 Other specified disorders of teeth and supporting structures: Secondary | ICD-10-CM | POA: Diagnosis not present

## 2018-04-30 DIAGNOSIS — O9989 Other specified diseases and conditions complicating pregnancy, childbirth and the puerperium: Secondary | ICD-10-CM | POA: Diagnosis not present

## 2018-04-30 NOTE — ED Triage Notes (Signed)
Pt reports right dental pain x 4 days. Has dental appointment for Monday. Pt is 6 months pregnant-reports taking tylenol PTA

## 2018-05-01 ENCOUNTER — Emergency Department (HOSPITAL_BASED_OUTPATIENT_CLINIC_OR_DEPARTMENT_OTHER)
Admission: EM | Admit: 2018-05-01 | Discharge: 2018-05-01 | Discharge status: Against Medical Advice | Payer: Medicaid Other | Attending: Emergency Medicine | Admitting: Emergency Medicine

## 2018-05-01 DIAGNOSIS — K0889 Other specified disorders of teeth and supporting structures: Secondary | ICD-10-CM

## 2018-05-01 MED ORDER — ACETAMINOPHEN 500 MG PO TABS
1000.0000 mg | ORAL_TABLET | Freq: Once | ORAL | Status: DC
  Filled 2018-05-01: qty 2

## 2018-05-01 NOTE — ED Notes (Signed)
Pt drove herself here.  MD offered a dental block that pt refused, offered a dose of Vicodin if pt could find a ride.  Pt upset, left the dept.  Ambulatory.

## 2018-05-01 NOTE — ED Notes (Addendum)
EDP into room, prior to RN assessment, see MD notes, pending orders.   

## 2018-05-01 NOTE — ED Notes (Signed)
Unable to assess or medicate pt. Pt left after seeing MD.

## 2018-05-01 NOTE — ED Provider Notes (Signed)
[facility] EMERGENCY DEPARTMENT Provider Note   CSN: [id number] Arrival date & time: 04/30/18  2306     History   Chief Complaint Chief Complaint  Patient presents with  . Dental Pain    HPI Taylor Chang is a 22 y.o. female.  Patient with right-sided lower dental pain for the past several days.  Denies any injury.  Denies difficulty breathing or difficulty swallowing.  No fever.  She is been taking Tylenol intermittently at home.  She does have a dental appointment next week.  She is approximately 6 months pregnant.  Denies any pregnancy complications.  No abdominal pain or vaginal bleeding.  The history is provided by the patient.  Dental Pain      History reviewed. No pertinent past medical history.  There are no active problems to display for this patient.   History reviewed. No pertinent surgical history.   OB History    Gravida  3   Para  1   Term  1   Preterm      AB  1   Living  1     SAB      TAB      Ectopic      Multiple      Live Births  1            Home Medications    Prior to Admission medications   Not on File    Family History History reviewed. No pertinent family history.  Social History Social History   Tobacco Use  . Smoking status: Never Smoker  . Smokeless tobacco: Never Used  Substance Use Topics  . Alcohol use: No  . Drug use: No     Allergies   Patient has no known allergies.   Review of Systems Review of Systems  Constitutional: Negative for activity change and appetite change.  HENT: Positive for dental problem. Negative for sore throat.   Respiratory: Negative for cough, chest tightness and shortness of breath.   Cardiovascular: Negative for chest pain.  Gastrointestinal: Negative for abdominal pain, nausea and vomiting.  Genitourinary: Negative for dysuria and hematuria.  Musculoskeletal: Negative for arthralgias and myalgias.  Skin: Negative for rash.  Neurological: Negative  for dizziness, weakness and headaches.    all other systems are negative except as noted in the HPI and PMH.    Physical Exam Updated Vital Signs BP 125/72 (BP Location: Left Arm)   Pulse 84   Temp 98.6 F (37 C) (Oral)   Resp 16   Ht 5\' 6"  (1.676 m)   Wt 75.8 kg (167 lb)   LMP 06/29/2017   SpO2 100%   BMI 26.95 kg/m   Physical Exam  Constitutional: She is oriented to person, place, and time. She appears well-developed and well-nourished. No distress.  HENT:  Head: Normocephalic and atraumatic.  Mouth/Throat: Oropharynx is clear and moist. No oropharyngeal exudate.  Braces intact.  Tooth that is affected does not have braces on it. Tenderness to palpation of first and second molar.  There is no abscess.  Floor mouth is soft.  No trismus.  Eyes: Pupils are equal, round, and reactive to light. Conjunctivae and EOM are normal.  Neck: Normal range of motion. Neck supple.  No meningismus.  Cardiovascular: Normal rate, regular rhythm, normal heart sounds and intact distal pulses.  No murmur heard. Pulmonary/Chest: Effort normal and breath sounds normal. No respiratory distress.  Abdominal: Soft. There is no tenderness. There is no rebound and no guarding.  Gravid abdomen  Musculoskeletal: Normal range of motion. She exhibits no edema or tenderness.  Neurological: She is alert and oriented to person, place, and time. No cranial nerve deficit. She exhibits normal muscle tone. Coordination normal.  No ataxia on finger to nose bilaterally. No pronator drift. 5/5 strength throughout. CN 2-12 intact.Equal grip strength. Sensation intact.   Skin: Skin is warm.  Psychiatric: She has a normal mood and affect. Her behavior is normal.  Nursing note and vitals reviewed.    ED Treatments / Results  Labs (all labs ordered are listed, but only abnormal results are displayed) Labs Reviewed - No data to display  EKG None  Radiology No results found.  Procedures Procedures (including  critical care time)  Medications Ordered in ED Medications  acetaminophen (TYLENOL) tablet 1,000 mg (1,000 mg Oral Not Given 05/01/18 0208)     Initial Impression / Assessment and Plan / ED Course  I have reviewed the triage vital signs and the nursing notes.  Pertinent labs & imaging results that were available during my care of the patient were reviewed by me and considered in my medical decision making (see chart for details).    Pregnancy with dental pain.  No evidence of abscess or Ludwig's angina.  Patient offered Tylenol.  She is also offered dental block which she declines.  Discussed that any medication will affect the baby. Did offer short course of Vicodin with the understanding that any medication given would have the potential to affect her baby. FHTs obtained by nursing staff. 132  Patient drove herself to the ED and does not have a ride home.  Discussed that we cannot give her Vicodin if she is driving. Offered to give short course of pain medication prescription as well as pain medication in the ED if patient could find a ride home.  Patient states that she has Tylenol at home and she left upset without receiving instructions. Final Clinical Impressions(s) / ED Diagnoses   Final diagnoses:  Pain, dental    ED Discharge Orders    None       Jarvin Ogren, Jeannett Senior, MD 05/01/18 [phone]

## 2018-06-08 ENCOUNTER — Other Ambulatory Visit: Payer: Self-pay

## 2018-06-08 ENCOUNTER — Emergency Department (HOSPITAL_BASED_OUTPATIENT_CLINIC_OR_DEPARTMENT_OTHER)
Admission: EM | Admit: 2018-06-08 | Discharge: 2018-06-08 | Disposition: A | Discharge status: Home / Self Care | Payer: Medicaid Other | Attending: Emergency Medicine | Admitting: Emergency Medicine

## 2018-06-08 ENCOUNTER — Encounter (HOSPITAL_BASED_OUTPATIENT_CLINIC_OR_DEPARTMENT_OTHER): Payer: Self-pay | Admitting: Emergency Medicine

## 2018-06-08 DIAGNOSIS — Z3A28 28 weeks gestation of pregnancy: Secondary | ICD-10-CM | POA: Insufficient documentation

## 2018-06-08 DIAGNOSIS — H5789 Other specified disorders of eye and adnexa: Secondary | ICD-10-CM | POA: Diagnosis not present

## 2018-06-08 DIAGNOSIS — O9989 Other specified diseases and conditions complicating pregnancy, childbirth and the puerperium: Secondary | ICD-10-CM | POA: Insufficient documentation

## 2018-06-08 MED ORDER — FLUORESCEIN SODIUM 1 MG OP STRP
1.0000 | ORAL_STRIP | Freq: Once | OPHTHALMIC | Status: AC
  Administered 2018-06-08: 1 via OPHTHALMIC
  Filled 2018-06-08: qty 1

## 2018-06-08 MED ORDER — TETRACAINE HCL 0.5 % OP SOLN
2.0000 [drp] | Freq: Once | OPHTHALMIC | Status: AC
  Administered 2018-06-08: 2 [drp] via OPHTHALMIC
  Filled 2018-06-08: qty 4

## 2018-06-08 MED ORDER — ERYTHROMYCIN 5 MG/GM OP OINT
TOPICAL_OINTMENT | Freq: Once | OPHTHALMIC | Status: AC
  Administered 2018-06-08: 1 via OPHTHALMIC
  Filled 2018-06-08: qty 3.5

## 2018-06-08 NOTE — ED Triage Notes (Signed)
Reports left eye erythema without drainage x 2 days.  Reports she had eyelashes done on Saturday and believes this is what caused this.

## 2018-06-08 NOTE — ED Provider Notes (Signed)
[facility] EMERGENCY DEPARTMENT Provider Note   CSN: [id number] Arrival date & time: 06/08/18  [id number]     History   Chief Complaint Chief Complaint  Patient presents with  . Eye Problem    HPI Taylor Chang is a 22 y.o. female who is previously healthy and 7 months pregnant who presents with a 4-day history of left eye irritation after wearing false eyelashes.  She reports it was rubbing her eye while she is wearing it.  She took them off the day after her eye has been improving, however it is still red and mildly irritated.  It is not painful.  There is been no drainage.  She has no vision problems.  She wears glasses, but does not have them with her.  She does not wear contact lenses.  She has not tried anything at home for symptoms.  HPI  History reviewed. No pertinent past medical history.  There are no active problems to display for this patient.   History reviewed. No pertinent surgical history.   OB History    Gravida  3   Para  1   Term  1   Preterm      AB  1   Living  1     SAB      TAB      Ectopic      Multiple      Live Births  1            Home Medications    Prior to Admission medications   Not on File    Family History History reviewed. No pertinent family history.  Social History Social History   Tobacco Use  . Smoking status: Never Smoker  . Smokeless tobacco: Never Used  Substance Use Topics  . Alcohol use: No  . Drug use: No     Allergies   Patient has no known allergies.   Review of Systems Review of Systems  Constitutional: Negative for fever.  Eyes: Positive for redness and itching. Negative for photophobia, pain, discharge and visual disturbance.     Physical Exam Updated Vital Signs BP 124/60 (BP Location: Right Arm)   Pulse 91   Temp 98.6 F (37 C) (Oral)   Resp 18   LMP 06/29/2017   SpO2 100%   Physical Exam  Constitutional: She appears well-developed and well-nourished. No  distress.  HENT:  Head: Normocephalic and atraumatic.  Mouth/Throat: Oropharynx is clear and moist. No oropharyngeal exudate.  Eyes: Pupils are equal, round, and reactive to light. EOM are normal. Right eye exhibits no discharge. Left eye exhibits no discharge. No scleral icterus.  Mild injection of the left conjunctiva, no photophobia or consensual photophobia, no fluorescein uptake noted, mild ecchymosis to the left, lateral upper lid Avg Tonopen pressure in the left: 15   Visual Acuity  Right Eye Distance: 20/100 Left Eye Distance: 20/100 Bilateral Distance: 20/70(pt wears glasses but does not have them with her )    Neck: Normal range of motion. Neck supple. No thyromegaly present.  Cardiovascular: Normal rate, regular rhythm, normal heart sounds and intact distal pulses. Exam reveals no gallop and no friction rub.  No murmur heard. Pulmonary/Chest: Effort normal and breath sounds normal. No stridor. No respiratory distress. She has no wheezes. She has no rales.  Abdominal: Soft. Bowel sounds are normal. She exhibits no distension. There is no tenderness. There is no rebound and no guarding.  Musculoskeletal: She exhibits no edema.  Lymphadenopathy:  She has no cervical adenopathy.  Neurological: She is alert. Coordination normal.  Skin: Skin is warm and dry. No rash noted. She is not diaphoretic. No pallor.  Psychiatric: She has a normal mood and affect.  Nursing note and vitals reviewed.    ED Treatments / Results  Labs (all labs ordered are listed, but only abnormal results are displayed) Labs Reviewed - No data to display  EKG None  Radiology No results found.  Procedures Procedures (including critical care time)  Medications Ordered in ED Medications  fluorescein ophthalmic strip 1 strip (1 strip Left Eye Given by Other 06/08/18 1013)  tetracaine (PONTOCAINE) 0.5 % ophthalmic solution 2 drop (2 drops Left Eye Given by Other 06/08/18 1013)  erythromycin  ophthalmic ointment (1 application Left Eye Given 06/08/18 1049)     Initial Impression / Assessment and Plan / ED Course  I have reviewed the triage vital signs and the nursing notes.  Pertinent labs & imaging results that were available during my care of the patient were reviewed by me and considered in my medical decision making (see chart for details).     Patient with left eye irritation after wearing false eyelashes.  No signs of corneal abrasion at this time, but will cover with erythromycin ointment.  No signs of dendritic lesion, hyphema.  Patient to follow-up with her own eye doctor or the ophthalmologist on discharge paperwork in 1 to 2 days for recheck.  Return precautions discussed.  Patient understands and agrees with plan.  Patient vitals stable throughout ED course and discharged in satisfactory condition.  Final Clinical Impressions(s) / ED Diagnoses   Final diagnoses:  Eye irritation    ED Discharge Orders    None       Emi Holes, PA-C 06/08/18 1052    Little, Ambrose Finland, MD 06/08/18 [phone]

## 2018-06-08 NOTE — Discharge Instructions (Signed)
Use erythromycin ointment 4 times daily for 5 days.  Please see your eye doctor or the eye doctor below in 1 to 2 days for recheck.  Please return the emergency department if you develop any new or worsening symptoms.

## 2018-11-06 ENCOUNTER — Other Ambulatory Visit: Payer: Self-pay

## 2018-11-06 ENCOUNTER — Encounter (HOSPITAL_BASED_OUTPATIENT_CLINIC_OR_DEPARTMENT_OTHER): Payer: Self-pay | Admitting: Emergency Medicine

## 2018-11-06 ENCOUNTER — Emergency Department (HOSPITAL_BASED_OUTPATIENT_CLINIC_OR_DEPARTMENT_OTHER)
Admission: EM | Admit: 2018-11-06 | Discharge: 2018-11-06 | Disposition: A | Discharge status: Home / Self Care | Payer: Medicaid Other | Attending: Emergency Medicine | Admitting: Emergency Medicine

## 2018-11-06 DIAGNOSIS — J111 Influenza due to unidentified influenza virus with other respiratory manifestations: Secondary | ICD-10-CM | POA: Diagnosis not present

## 2018-11-06 DIAGNOSIS — K047 Periapical abscess without sinus: Secondary | ICD-10-CM | POA: Diagnosis not present

## 2018-11-06 DIAGNOSIS — R6889 Other general symptoms and signs: Secondary | ICD-10-CM

## 2018-11-06 DIAGNOSIS — J029 Acute pharyngitis, unspecified: Secondary | ICD-10-CM | POA: Diagnosis present

## 2018-11-06 MED ORDER — AMOXICILLIN-POT CLAVULANATE 875-125 MG PO TABS: 1.0000 | ORAL_TABLET | Freq: Two times a day (BID) | ORAL | 0 refills | Status: DC

## 2018-11-06 MED ORDER — IBUPROFEN 600 MG PO TABS: 600.0000 mg | ORAL_TABLET | Freq: Three times a day (TID) | ORAL | 0 refills | Status: DC | PRN

## 2018-11-06 NOTE — ED Triage Notes (Signed)
Pt c/o flu-like sx x 4 days.

## 2018-11-06 NOTE — ED Provider Notes (Signed)
[facility] EMERGENCY DEPARTMENT Provider Note   CSN: [id number] Arrival date & time: 11/06/18  [id number]     History   Chief Complaint Chief Complaint  Patient presents with  . Flu-like symptoms    HPI Taylor Chang is a 22 y.o. female.  HPI Patient reports she started getting sore throat, earache and body aches about 4 days ago.  She reports she has been having hot cold episodes feeling like she has chills.  She denies shortness of breath or severe cough.  No chest pain.  No abdominal pain.  She reports the symptoms actually seem a little better today but she is not used to being sick so she wanted to come and get checked at the hospital.  Patient is 11 weeks postpartum.  She is not breast-feeding.  No GU or GI complaints.  Patient has second complaint which is swelling of her right mandible.  She reports she is been treated with antibiotics before for dental infection.  She reports this started swelling a couple of days ago as well.  She reports it was actually more swollen and has subsequently drained some material and seems a little better.  She reports it got better on previous occasion with antibiotics.  Patient does have braces and thus is seeing a dentist regularly. History reviewed. No pertinent past medical history.  There are no active problems to display for this patient.   History reviewed. No pertinent surgical history.   OB History    Gravida  3   Para  1   Term  1   Preterm      AB  1   Living  1     SAB      TAB      Ectopic      Multiple      Live Births  1            Home Medications    Prior to Admission medications   Medication Sig Start Date End Date Taking? Authorizing Provider  amoxicillin-clavulanate (AUGMENTIN) 875-125 MG tablet Take 1 tablet by mouth 2 (two) times daily. One po bid x 7 days 11/06/18   Arby BarrettePfeiffer, Karita Dralle, MD  ibuprofen (ADVIL,MOTRIN) 600 MG tablet Take 1 tablet (600 mg total) by mouth every 8 (eight)  hours as needed. 11/06/18   Arby BarrettePfeiffer, Kade Rickels, MD    Family History History reviewed. No pertinent family history.  Social History Social History   Tobacco Use  . Smoking status: Never Smoker  . Smokeless tobacco: Never Used  Substance Use Topics  . Alcohol use: No  . Drug use: No     Allergies   Patient has no known allergies.   Review of Systems Review of Systems 10 Systems reviewed and are negative for acute change except as noted in the HPI.   Physical Exam Updated Vital Signs BP (!) 142/85 (BP Location: Left Arm)   Pulse 74   Temp 98.9 F (37.2 C) (Oral)   Resp 20   Ht 5' 5.5" (1.664 m)   Wt 59 kg   LMP 10/30/2018 (Approximate)   SpO2 98%   Breastfeeding No   BMI 21.30 kg/m   Physical Exam Constitutional:      Comments: Patient is alert and nontoxic.  No respiratory distress.  Clinically well in appearance.  HENT:     Head:     Comments: Posterior oropharynx is widely patent.  No erythema or exudate.  Patient has braces on her teeth.  Dentition  is in good condition.  There is no abscess or objective area specific swelling around the inferior molars of the right side.  Patient's mandible however has a moderate, grape size swelling that is mildly tender.  She does not have overlying facial cellulitis.  The neck is supple.  Bilateral TMs normal.    Nose: Nose normal.  Eyes:     Extraocular Movements: Extraocular movements intact.  Neck:     Musculoskeletal: Normal range of motion and neck supple.  Cardiovascular:     Rate and Rhythm: Normal rate and regular rhythm.  Pulmonary:     Effort: Pulmonary effort is normal.     Breath sounds: Normal breath sounds.  Abdominal:     General: Abdomen is flat. There is no distension.     Tenderness: There is no abdominal tenderness. There is no guarding.  Musculoskeletal: Normal range of motion.        General: No swelling or tenderness.     Right lower leg: No edema.     Left lower leg: No edema.  Skin:     General: Skin is warm and dry.  Neurological:     General: No focal deficit present.     Mental Status: She is oriented to person, place, and time.     Motor: No weakness.     Coordination: Coordination normal.     Gait: Gait normal.  Psychiatric:        Mood and Affect: Mood normal.      ED Treatments / Results  Labs (all labs ordered are listed, but only abnormal results are displayed) Labs Reviewed - No data to display  EKG None  Radiology No results found.  Procedures Procedures (including critical care time)  Medications Ordered in ED Medications - No data to display   Initial Impression / Assessment and Plan / ED Course  I have reviewed the triage vital signs and the nursing notes.  Pertinent labs & imaging results that were available during my care of the patient were reviewed by me and considered in my medical decision making (see chart for details).    Patient is clinically well in appearance.  She describes flulike symptoms that today she perceives to be improving.  She also has evidence of apical abscess.  This may have drained a bit spontaneously within the past 2 days.  She still has objective swelling on the mandible although no cellulitis or significant tenderness.  No trismus.  Patient will be put on Augmentin.  She does have braces and gets routine dental care.  She is counseled on necessity to follow-up quickly for recheck with a dentist.  Viral symptoms appear to be improving.  No signs of bacterial pneumonia.  Patient is clinically well without respiratory distress or chest pain.  She denies significant cough with her symptoms.  Return precautions reviewed.  Final Clinical Impressions(s) / ED Diagnoses   Final diagnoses:  Apical alveolar abscess  Flu-like symptoms    ED Discharge Orders         Ordered    amoxicillin-clavulanate (AUGMENTIN) 875-125 MG tablet  2 times daily     11/06/18 1049    ibuprofen (ADVIL,MOTRIN) 600 MG tablet  Every 8  hours PRN     11/06/18 1049           Arby BarrettePfeiffer, Maurico Perrell, MD 11/06/18 1057

## 2018-11-06 NOTE — Discharge Instructions (Signed)
1.  Take antibiotics for your dental abscess.  It is very poor and that you make a follow-up appointment with a dentist within the next 1 to 2 weeks.  Return to the emergency department if the swelling or pain are worsening. 2.  You may have influenza.  Your symptoms appear to be improving at this point.  Continue to take ibuprofen and Tylenol as needed for body aches.  Return if you develop high fever, cough, shortness of breath or concerning symptoms.

## 2018-11-06 NOTE — ED Notes (Signed)
Right lower gums us swollen and per patient, pus was coming out.  No drainage noted at this time.

## 2018-11-06 NOTE — ED Notes (Signed)
Pt verbalized understanding of dc instructions.

## 2019-01-02 ENCOUNTER — Other Ambulatory Visit: Payer: Self-pay

## 2019-01-02 ENCOUNTER — Encounter (HOSPITAL_BASED_OUTPATIENT_CLINIC_OR_DEPARTMENT_OTHER): Payer: Self-pay

## 2019-01-02 ENCOUNTER — Emergency Department (HOSPITAL_BASED_OUTPATIENT_CLINIC_OR_DEPARTMENT_OTHER)
Admission: EM | Admit: 2019-01-02 | Discharge: 2019-01-02 | Disposition: A | Discharge status: Home / Self Care | Payer: Medicaid Other | Attending: Emergency Medicine | Admitting: Emergency Medicine

## 2019-01-02 DIAGNOSIS — Z5321 Procedure and treatment not carried out due to patient leaving prior to being seen by health care provider: Secondary | ICD-10-CM | POA: Diagnosis not present

## 2019-01-02 DIAGNOSIS — M549 Dorsalgia, unspecified: Secondary | ICD-10-CM | POA: Insufficient documentation
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
## 2019-12-15 ENCOUNTER — Other Ambulatory Visit: Payer: Self-pay

## 2019-12-15 ENCOUNTER — Encounter (HOSPITAL_BASED_OUTPATIENT_CLINIC_OR_DEPARTMENT_OTHER): Payer: Self-pay

## 2019-12-15 ENCOUNTER — Emergency Department (HOSPITAL_BASED_OUTPATIENT_CLINIC_OR_DEPARTMENT_OTHER)
Admission: EM | Admit: 2019-12-15 | Discharge: 2019-12-15 | Disposition: A | Discharge status: Home / Self Care | Payer: Medicaid Other | Attending: Emergency Medicine | Admitting: Emergency Medicine

## 2019-12-15 DIAGNOSIS — L0231 Cutaneous abscess of buttock: Secondary | ICD-10-CM | POA: Insufficient documentation

## 2019-12-15 MED ORDER — IBUPROFEN 800 MG PO TABS: 800.0000 mg | ORAL_TABLET | Freq: Three times a day (TID) | ORAL | 0 refills | Status: DC

## 2019-12-15 MED ORDER — LIDOCAINE-EPINEPHRINE (PF) 2 %-1:200000 IJ SOLN
10.0000 mL | Freq: Once | INTRAMUSCULAR | Status: AC
  Administered 2019-12-15: 10 mL
  Filled 2019-12-15: qty 10

## 2019-12-15 MED ORDER — DOXYCYCLINE HYCLATE 100 MG PO CAPS: 100.0000 mg | ORAL_CAPSULE | Freq: Two times a day (BID) | ORAL | 0 refills | Status: DC

## 2019-12-15 MED FILL — IBUPROFEN 800 MG TAB: 800 | 7 days supply | Qty: 21 | Fill #0

## 2019-12-15 MED FILL — DOXYCYCLINE HYCLATE 100 MG: 100 | 7 days supply | Qty: 14 | Fill #0

## 2019-12-15 NOTE — ED Notes (Signed)
I & D tray/suture tray at bedside

## 2019-12-15 NOTE — Discharge Instructions (Signed)
1.  Sit in a very warm bath to clean your wound and keep the area open and draining.  Soak for about 20 minutes.  Once you have done this, dry the area well and applied dressing. 2.  Start doxycycline and take as directed.  You may use ibuprofen 800 mg every 8 hours as needed for pain. 3.  You should have a recheck with your family doctor within 2 days.  Return to the emergency department if you are experiencing increasing pain, increasing swelling, redness, fevers or other concerning symptoms.

## 2019-12-15 NOTE — ED Provider Notes (Signed)
[facility] EMERGENCY DEPARTMENT Provider Note   CSN: [id number] Arrival date & time: 12/15/19  0827     History Chief Complaint  Patient presents with  . Abscess    Taylor Chang is a 24 y.o. female.  HPI Patient reports she started getting a "hair bump" on her left buttocks about 2 days ago.  She was trying to squeeze it and extracted.  Some drainage was coming out but today it got very swollen and painful.  It is painful to sit on the buttock.  No other associated symptoms.  She does not feel sick.  No fevers, no chills.    History reviewed. No pertinent past medical history.  There are no problems to display for this patient.   History reviewed. No pertinent surgical history.   OB History    Gravida  3   Para  1   Term  1   Preterm      AB  1   Living  1     SAB      TAB      Ectopic      Multiple      Live Births  1           No family history on file.  Social History   Tobacco Use  . Smoking status: Never Smoker  . Smokeless tobacco: Never Used  Substance Use Topics  . Alcohol use: No  . Drug use: No    Home Medications Prior to Admission medications   Medication Sig Start Date End Date Taking? Authorizing Provider  amoxicillin-clavulanate (AUGMENTIN) 875-125 MG tablet Take 1 tablet by mouth 2 (two) times daily. One po bid x 7 days 11/06/18   Arby Barrette, MD  doxycycline (VIBRAMYCIN) 100 MG capsule Take 1 capsule (100 mg total) by mouth 2 (two) times daily. One po bid x 7 days 12/15/19   Arby Barrette, MD  ibuprofen (ADVIL) 800 MG tablet Take 1 tablet (800 mg total) by mouth 3 (three) times daily. 12/15/19   Arby Barrette, MD  ibuprofen (ADVIL,MOTRIN) 600 MG tablet Take 1 tablet (600 mg total) by mouth every 8 (eight) hours as needed. 11/06/18   Arby Barrette, MD    Allergies    Patient has no known allergies.  Review of Systems   Review of Systems 10 Systems reviewed and are negative for acute change except  as noted in the HPI.  Physical Exam Updated Vital Signs BP (!) 146/81 (BP Location: Right Arm)   Pulse 70   Temp 99.5 F (37.5 C) (Oral)   Resp 18   Ht 5\' 6"  (1.676 m)   Wt 72.6 kg   LMP 11/19/2019   SpO2 100%   BMI 25.82 kg/m   Physical Exam Constitutional:      Appearance: Normal appearance.  Pulmonary:     Breath sounds: Normal breath sounds.  Musculoskeletal:        General: No swelling. Normal range of motion.  Skin:    General: Skin is warm and dry.     Findings: Lesion present.     Comments: Patient has an area of abscess on the left buttock.  This is on the lower aspect of the gluteus but not associated with the gluteal cleft or anal region.  There is approximately 1 cm area of erosion with some purulent drainage.  There is an associated area of firm induration about ping-pong ball sized.  This is very tender.  Neurological:  General: No focal deficit present.     Mental Status: She is alert and oriented to person, place, and time.     Coordination: Coordination normal.  Psychiatric:        Mood and Affect: Mood normal.     ED Results / Procedures / Treatments   Labs (all labs ordered are listed, but only abnormal results are displayed) Labs Reviewed - No data to display  EKG None  Radiology No results found.  Procedures .Marland KitchenIncision and Drainage  Date/Time: 12/15/2019 9:11 AM Performed by: Charlesetta Shanks, MD Authorized by: Charlesetta Shanks, MD   Consent:    Consent obtained:  Verbal   Consent given by:  Patient   Risks discussed:  Bleeding, incomplete drainage, pain and infection   Alternatives discussed:  Delayed treatment Location:    Type:  Abscess   Location:  Trunk Pre-procedure details:    Skin preparation:  Betadine Anesthesia (see MAR for exact dosages):    Anesthesia method:  Local infiltration   Local anesthetic:  Lidocaine 2% WITH epi Procedure type:    Complexity:  Simple Procedure details:    Incision types:  Single  straight   Incision depth:  Subcutaneous   Scalpel blade:  11   Wound management:  Probed and deloculated   Drainage:  Purulent   Drainage amount:  Copious   Wound treatment:  Wound left open Post-procedure details:    Patient tolerance of procedure:  Tolerated with difficulty Comments:     Patient has a focal abscess on the buttock.  Not associated with the perineum or perianal region.  Patient was extremely anxious about needles and needed a lot of reassurance in order to proceed with the procedure.  Otherwise uncomplicated but large focal abscess.  There is an approximately 5 cm associated area of induration.  Copious purulent material expressed..   (including critical care time)  Medications Ordered in ED Medications  lidocaine-EPINEPHrine (XYLOCAINE W/EPI) 2 %-1:200000 (PF) injection 10 mL (10 mLs Infiltration Given 12/15/19 0847)    ED Course  I have reviewed the triage vital signs and the nursing notes.  Pertinent labs & imaging results that were available during my care of the patient were reviewed by me and considered in my medical decision making (see chart for details).    MDM Rules/Calculators/A&P                      Otherwise healthy patient.  Focal abscess on the buttock.  Managed by incision and drainage.  Patient counseled on return precautions and home management. Final Clinical Impression(s) / ED Diagnoses Final diagnoses:  Abscess of buttock, left    Rx / DC Orders ED Discharge Orders         Ordered    doxycycline (VIBRAMYCIN) 100 MG capsule  2 times daily     12/15/19 0906    ibuprofen (ADVIL) 800 MG tablet  3 times daily     12/15/19 8338           Charlesetta Shanks, MD 12/15/19 [phone]

## 2019-12-15 NOTE — ED Notes (Signed)
ED Provider at bedside. 

## 2019-12-15 NOTE — ED Triage Notes (Signed)
Pt arrives ambulatory to ED with reports of a "irritated hair bump on the left butt check".

## 2020-11-16 DIAGNOSIS — I10 Essential (primary) hypertension: Secondary | ICD-10-CM

## 2020-11-16 HISTORY — DX: Essential (primary) hypertension: I10

## 2020-12-08 DIAGNOSIS — O4590 Premature separation of placenta, unspecified, unspecified trimester: Secondary | ICD-10-CM

## 2021-03-05 ENCOUNTER — Ambulatory Visit: Payer: Medicaid Other

## 2021-04-21 ENCOUNTER — Ambulatory Visit (INDEPENDENT_AMBULATORY_CARE_PROVIDER_SITE_OTHER): Payer: Medicaid Other | Admitting: Obstetrics and Gynecology

## 2021-04-21 ENCOUNTER — Other Ambulatory Visit: Payer: Self-pay

## 2021-04-21 ENCOUNTER — Ambulatory Visit: Payer: Self-pay

## 2021-04-21 ENCOUNTER — Ambulatory Visit (INDEPENDENT_AMBULATORY_CARE_PROVIDER_SITE_OTHER): Payer: Medicaid Other

## 2021-04-21 VITALS — BP 146/94 | HR 76 | Ht 65.0 in | Wt 168.0 lb

## 2021-04-21 DIAGNOSIS — O3680X0 Pregnancy with inconclusive fetal viability, not applicable or unspecified: Secondary | ICD-10-CM | POA: Diagnosis not present

## 2021-04-21 DIAGNOSIS — Z3A01 Less than 8 weeks gestation of pregnancy: Secondary | ICD-10-CM

## 2021-04-21 DIAGNOSIS — O0990 Supervision of high risk pregnancy, unspecified, unspecified trimester: Secondary | ICD-10-CM

## 2021-04-21 MED ORDER — BLOOD PRESSURE KIT DEVI: 1.0000 | 0 refills | Status: DC

## 2021-04-21 NOTE — Progress Notes (Signed)
New OB Intake  I connected with  Taylor Chang on 04/21/21 at  8:15 AM EDT by in office and verified that I am speaking with the correct person using two identifiers. Nurse is located at [place] and pt is located at in office.  I discussed the limitations, risks, security and privacy concerns of performing an evaluation and management service by telephone and the availability of in person appointments. I also discussed with the patient that there may be a patient responsible charge related to this service. The patient expressed understanding and agreed to proceed.  I explained I am completing New OB Intake today. We discussed her EDD of unsure that is based on LMP of unknown. Pt is G5/P2. I reviewed her allergies, medications, Medical/Surgical/OB history, and appropriate screenings. I informed her of [facility] services. Based on history, this is a/an complicated by hypertension pregnancy.  There are no problems to display for this patient.   Concerns addressed today  Delivery Plans:  Plans to deliver at [facility].   MyChart/Babyscripts MyChart access verified. I explained pt will have some visits in office and some virtually. Babyscripts instructions given and order placed. Patient verifies receipt of registration text/e-mail. Account successfully created and app downloaded.  Blood Pressure Cuff Blood pressure cuff ordered for patient to pick-up from [employer]. Explained after first prenatal appt pt will check weekly and document in Babyscripts.  Anatomy US Explained first scheduled [place] will be around 19 weeks. Dating and Viability scan performed today.  Labs Discussed Avelina Laine genetic screening with patient. Would like both Panorama and Horizon drawn at new OB visit. Routine prenatal labs needed.  Covid Vaccine Patient has not covid vaccine.   Social Determinants of Health . Food Insecurity: Patient denies food insecurity. . WIC Referral: Patient is interested in referral to [facility].   . Transportation: Patient denies transportation needs. . Childcare: Discussed no children allowed at ultrasound appointments. Offered childcare services; patient declines childcare services at this time.  First visit review I reviewed new OB appt with pt. I explained she will have a pelvic exam, ob bloodwork with genetic screening, and PAP smear. Explained pt will be seen by unknown at first visit; encounter routed to appropriate provider. Explained that patient will be seen by pregnancy navigator following visit with provider.  Hamilton Capri, RN 04/21/2021  8:30 AM

## 2021-04-21 NOTE — Progress Notes (Signed)
GYNECOLOGY OFFICE NOTE  History:  25 y.o. [id number] here today for NOB nurse intake. Patient seen at RN request for no cardiac activity.  Patient had PEC with DIC and placental abruption with emergent CS delivery in 11/2020 at [facility] with an IUFD. She did not have a period post partum and has been on amlodipine. Had one episode of spotting in March and took a pregnancy test since she did not resume periods. Pregnancy test postiive so she saw PCP who did HCG and said she was about 7 weeks but does not have those records.  Today, presented for NOB and found to have irregularly shaped IUGS with embryo measuring 68w4dwith no cardiac activity. No complaints.  Past Medical History:  Diagnosis Date  . Hypertension 2022    Past Surgical History:  Procedure Laterality Date  . CESAREAN SECTION  11/2020  . WISDOM TOOTH EXTRACTION  2019     Current Outpatient Medications:  .  amLODipine (NORVASC) 2.5 MG tablet, amlodipine 2.5 mg tablet, Disp: , Rfl:  .  Blood Pressure Monitoring (BLOOD PRESSURE KIT) DEVI, 1 kit by Does not apply route once a week., Disp: 1 each, Rfl: 0 .  Prenatal-Fe Cbn-Fe Asp Gly-FA (OB COMPLETE PREMIER) 30-20-1 MG TABS, Take 1 tablet by mouth daily as needed., Disp: , Rfl:   The following portions of the patient's history were reviewed and updated as appropriate: allergies, current medications, past family history, past medical history, past social history, past surgical history and problem list.   Review of Systems:  Pertinent items noted in HPI and remainder of comprehensive ROS otherwise negative.   Objective:  Physical Exam BP (!) 146/94   Pulse 76   Ht 5' 5"  (1.651 m)   Wt 168 lb (76.2 kg)   BMI 27.96 kg/m  CONSTITUTIONAL: Well-developed, well-nourished female in no acute distress.  HENT:  Normocephalic, atraumatic. External right and left ear normal. Oropharynx is clear and moist EYES: Conjunctivae and EOM are normal. Pupils are equal, round, and reactive  to light. No scleral icterus.  NECK: Normal range of motion, supple, no masses SKIN: Skin is warm and dry. No rash noted. Not diaphoretic. No erythema. No pallor. NEUROLOGIC: Alert and oriented to person, place, and time. Normal reflexes, muscle tone coordination. No cranial nerve deficit noted. PSYCHIATRIC: Normal mood and affect. Normal behavior. Normal judgment and thought content. CARDIOVASCULAR: Normal heart rate noted RESPIRATORY: Effort normal, no problems with respiration noted ABDOMEN: Soft, no distention noted.   PELVIC: Normal appearing external genitali MUSCULOSKELETAL: Normal range of motion. No edema noted.  Exam done with chaperone present.  Labs and Imaging No results found.  Assessment & Plan:  1. Supervision of high risk pregnancy, antepartum - Blood Pressure Monitoring (BLOOD PRESSURE KIT) DEVI; 1 kit by Does not apply route once a week.  Dispense: 1 each; Refill: 0 - [place]; Future - [place]; Future  2. Encounter to determine fetal viability of pregnancy, single or unspecified fetus - Performed UK[place]elf, embryo noted with no cardiac activity seen - reviewed findings with patient, that with irregularly shaped gestational sac, this may not be a viable pregnancy, but also it simply may be too early to see cardiac activity since we do not have dates by period - HCG today and f/u 2 days from now - repeat [place]ain a week - [place] Limited; Future - [place] Limited; Future  3. Pregnancy with inconclusive fetal viability, single or unspecified fetus - Beta hCG  quant (ref lab)   Routine preventative health maintenance measures emphasized. Please refer to After Visit Summary for other counseling recommendations.   Return in about 2 days (around 04/23/2021) for repeat HCG.   Feliz Beam, MD, [place] for [facility])

## 2021-04-22 LAB — BETA HCG QUANT (REF LAB): hCG Quant: 53591 m[IU]/mL

## 2021-04-23 ENCOUNTER — Other Ambulatory Visit: Payer: Self-pay

## 2021-04-23 ENCOUNTER — Other Ambulatory Visit: Payer: Medicaid Other

## 2021-04-23 ENCOUNTER — Other Ambulatory Visit: Payer: Self-pay | Admitting: Obstetrics

## 2021-04-23 DIAGNOSIS — O3680X0 Pregnancy with inconclusive fetal viability, not applicable or unspecified: Secondary | ICD-10-CM

## 2021-04-23 NOTE — Progress Notes (Unsigned)
Quant ordered today 

## 2021-04-24 ENCOUNTER — Telehealth: Payer: Self-pay

## 2021-04-24 ENCOUNTER — Other Ambulatory Visit: Payer: Self-pay | Admitting: Obstetrics

## 2021-04-24 LAB — BETA HCG QUANT (REF LAB): hCG Quant: 45915 m[IU]/mL

## 2021-04-24 NOTE — Telephone Encounter (Signed)
TC from patient, she is concern about her hcg level drop. Advise patient the provider will reviewed her results and the office will give her a call back. Patient does not report any bleeding or discomfort.  Ectopic precaution have been reviewed with patient.   HCG- 04/21/2021- 69,678 HCG-04/23/2021- 93,810

## 2021-04-24 NOTE — Telephone Encounter (Signed)
I called patient to review results hcg results with her. Patient made aware of decrease in hcg levels. Patient informed that a decrease is a result from a failed pregnancy. Patient informed that she may start bleeding and may have some cramping. Advised that patient follow up with provider in the office next week and have a follow up quant per provider. Patient verbalized understanding. I asked patient if she wanted me to transfer her to the front desk to schedule appt. She declined and states that she will call back tomorrow. Patient states that she did not have any further questions. Condolences provided.

## 2021-04-24 NOTE — Progress Notes (Signed)
The patient has a quantitative beta HCG level that has decreased from 53,000 to 45,000 and an ultrasound that revealed only an irregular gestational sac with no fetal pole that is consistent with a failed pregnancy.  Patient called and given the results.  A/P:  Blighted ovum.  Patient will follow-up in the office in 1 week.  Expectant management for now.  Brock Bad, MD 04/24/2021 4:30 PM

## 2021-04-29 ENCOUNTER — Telehealth: Payer: Self-pay

## 2021-04-29 NOTE — Telephone Encounter (Signed)
Patient called stating that she has started bleeding yesterday and passed a large clot. Patient advised that she should go to the hospital if she starts filling a pad every 1-2 hours with severe cramping. Patient verbalized understanding. Patient states that she is only sees blood when she wipes today. Patient advised to to make sure she comes to her appt tomorrow for follow up and repeat hcg.

## 2021-04-30 ENCOUNTER — Other Ambulatory Visit: Payer: Self-pay

## 2021-04-30 ENCOUNTER — Ambulatory Visit (INDEPENDENT_AMBULATORY_CARE_PROVIDER_SITE_OTHER): Payer: Medicaid Other | Admitting: Obstetrics and Gynecology

## 2021-04-30 ENCOUNTER — Encounter: Payer: Self-pay | Admitting: Obstetrics and Gynecology

## 2021-04-30 VITALS — BP 128/83 | HR 97 | Wt 164.0 lb

## 2021-04-30 DIAGNOSIS — O0990 Supervision of high risk pregnancy, unspecified, unspecified trimester: Secondary | ICD-10-CM

## 2021-04-30 NOTE — Progress Notes (Signed)
Patient ID: Taylor Chang, female   DOB: 1995-12-09, 25 y.o.   MRN: [id number] Taylor Chang presents for follow up from U/S 1 week ago. [redacted]w[redacted]d, irregular sac and no cardiac activity. Pt had bleeding over weekend and first of the week like a cycle. Now just some spotting.  PE AF VSS Lungs clear Heart RRR Abd soft + BS  A/P Suspect SAB  Will check OB U/S to be sure pt has had complete AB. Discussed contraception options, pt is considering either OCP's or IUD. Await U/S before proceeding with contraception. F/U as per U/S results.

## 2021-04-30 NOTE — Progress Notes (Signed)
Patient presents for F/U on miscarriage.  Last HCG on 04/23/21 level was 45,915 decreased from 53,591 on 04/21/21.  Pt notes light bleeding today.

## 2021-05-08 ENCOUNTER — Inpatient Hospital Stay: Admission: RE | Admit: 2021-05-08 | Payer: Medicaid Other | Source: Ambulatory Visit

## 2021-05-13 ENCOUNTER — Ambulatory Visit: Admission: RE | Admit: 2021-05-13 | Payer: Medicaid Other | Source: Ambulatory Visit

## 2021-05-29 ENCOUNTER — Other Ambulatory Visit: Payer: Medicaid Other

## 2021-06-02 ENCOUNTER — Ambulatory Visit: Admission: RE | Admit: 2021-06-02 | Payer: Medicaid Other | Source: Ambulatory Visit

## 2021-06-24 ENCOUNTER — Ambulatory Visit
Admission: RE | Admit: 2021-06-24 | Discharge: 2021-06-24 | Disposition: A | Discharge status: Home / Self Care | Payer: Medicaid Other | Source: Ambulatory Visit | Attending: Obstetrics and Gynecology | Admitting: Obstetrics and Gynecology

## 2021-06-24 ENCOUNTER — Other Ambulatory Visit: Payer: Self-pay

## 2021-06-24 ENCOUNTER — Ambulatory Visit: Payer: Medicaid Other | Admitting: *Deleted

## 2021-06-24 DIAGNOSIS — O0990 Supervision of high risk pregnancy, unspecified, unspecified trimester: Secondary | ICD-10-CM | POA: Insufficient documentation

## 2021-06-24 DIAGNOSIS — O3680X0 Pregnancy with inconclusive fetal viability, not applicable or unspecified: Secondary | ICD-10-CM
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
## 2021-06-25 ENCOUNTER — Telehealth: Payer: Self-pay

## 2021-06-25 DIAGNOSIS — O3680X0 Pregnancy with inconclusive fetal viability, not applicable or unspecified: Secondary | ICD-10-CM

## 2021-06-26 ENCOUNTER — Other Ambulatory Visit: Payer: Medicaid Other

## 2021-06-26 NOTE — Telephone Encounter (Signed)
Open in error

## 2021-07-02 ENCOUNTER — Ambulatory Visit: Payer: Medicaid Other | Admitting: Women's Health

## 2021-07-08 ENCOUNTER — Ambulatory Visit: Payer: Medicaid Other | Admitting: Family Medicine

## 2021-07-09 ENCOUNTER — Ambulatory Visit: Payer: Medicaid Other | Admitting: Obstetrics & Gynecology

## 2021-07-09 ENCOUNTER — Ambulatory Visit
Admission: RE | Admit: 2021-07-09 | Discharge: 2021-07-09 | Disposition: A | Discharge status: Home / Self Care | Payer: Medicaid Other | Source: Ambulatory Visit | Attending: Obstetrics and Gynecology | Admitting: Obstetrics and Gynecology

## 2021-07-09 ENCOUNTER — Other Ambulatory Visit: Payer: Self-pay

## 2021-07-09 ENCOUNTER — Encounter: Payer: Self-pay | Admitting: Obstetrics & Gynecology

## 2021-07-09 VITALS — BP 141/82 | HR 89 | Wt 162.0 lb

## 2021-07-09 DIAGNOSIS — O3680X0 Pregnancy with inconclusive fetal viability, not applicable or unspecified: Secondary | ICD-10-CM | POA: Insufficient documentation

## 2021-07-09 NOTE — Progress Notes (Signed)
Ultrasounds Results Note  SUBJECTIVE HPI:  Ms. Taylor Chang is a 25 y.o. [id number] at Unknown by LMP  LMP49/10 6 weekswho presents or followup ultrasound results. The patient denies abdominal pain or vaginal bleeding. [place] f/u scheduled today  Past Medical History:  Diagnosis Date   Hypertension 2022   Past Surgical History:  Procedure Laterality Date   CESAREAN SECTION  11/2020   WISDOM TOOTH EXTRACTION  2019   Social History   Socioeconomic History   Marital status: Single    Spouse name: Not on file   Number of children: Not on file   Years of education: Not on file   Highest education level: Not on file  Occupational History   Not on file  Tobacco Use   Smoking status: Never   Smokeless tobacco: Never  Vaping Use   Vaping Use: Former  Substance and Sexual Activity   Alcohol use: No   Drug use: No   Sexual activity: Yes    Partners: Male    Birth control/protection: None  Other Topics Concern   Not on file  Social History Narrative   Not on file   Social Determinants of Health   Financial Resource Strain: Not on file  Food Insecurity: Not on file  Transportation Needs: Not on file  Physical Activity: Not on file  Stress: Not on file  Social Connections: Not on file  Intimate Partner Violence: Not on file   Current Outpatient Medications on File Prior to Visit  Medication Sig Dispense Refill   amLODipine (NORVASC) 2.5 MG tablet amlodipine 2.5 mg tablet     Blood Pressure Monitoring (BLOOD PRESSURE KIT) DEVI 1 kit by Does not apply route once a week. 1 each 0   Prenatal-Fe Cbn-Fe Asp Gly-FA (OB COMPLETE PREMIER) 30-20-1 MG TABS Take 1 tablet by mouth daily as needed.     No current facility-administered medications on file prior to visit.   No Known Allergies  I have reviewed patient's Past Medical Hx, Surgical Hx, Family Hx, Social Hx, medications and allergies.   Review of Systems Review of Systems  Constitutional: Negative for fever and chills.   Gastrointestinal: Negative for nausea, vomiting, abdominal pain, diarrhea and constipation.  Genitourinary: Negative for dysuria.  Musculoskeletal: Negative for back pain.  Neurological: Negative for dizziness and weakness.    Physical Exam  BP (!) 141/82   Pulse 89   Wt 162 lb (73.5 kg)   BMI 26.96 kg/m   GENERAL: Well-developed, well-nourished female in no acute distress.  HEENT: Normocephalic, atraumatic.   LUNGS: Effort normal ABDOMEN: soft, non-tender HEART: Regular rate  SKIN: Warm, dry and without erythema PSYCH: Normal mood and affect NEURO: Alert and oriented x 4  LAB RESULTS No results found for this or any previous visit (from the past 24 hour(s)).  IMAGING US OB Transvaginal  Result Date: 06/24/2021 CLINICAL DATA:  Viability scan. Supervision of high-risk pregnancy, antepartum. Beta hCG not provided. EXAM: TRANSVAGINAL OB ULTRASOUND TECHNIQUE: Transvaginal ultrasound was performed for complete evaluation of the gestation as well as the maternal uterus, adnexal regions, and pelvic cul-de-sac. COMPARISON:  None this pregnancy. FINDINGS: Intrauterine gestational sac: None Yolk sac:  Not Visualized. Embryo:  Not Visualized. Maternal uterus/adnexae: The uterus is retroverted. There is no intrauterine gestational sac. The endometrium is thickened at 18 mm. No fluid in the endometrial canal. Right ovary is normal measuring 4.2 x 2.4 x 3.9 cm. Ovarian blood flow is seen. The left ovary is normal measuring 2.4 x 1.6 x  2.2 cm. Ovarian blood flow is seen. No ovarian cyst or adnexal mass. No pelvic free fluid. IMPRESSION: No intrauterine pregnancy or findings suspicious for ectopic pregnancy. Findings are consistent with pregnancy of unknown location and may reflect early intrauterine pregnancy not yet visualized sonographically, occult ectopic pregnancy, or failed pregnancy. Recommend trending of beta HCG and follow-up ultrasound in 7-10 days based on beta HCG trends. Electronically  Signed   By: Keith Rake M.D.   On: 06/24/2021 16:35    ASSESSMENT 1. Pregnancy of unknown anatomic location     PLAN Discharge home in stable condition Patient advised to start/continue taking prenatal vitamin.  HCG today  Woodroe Mode, MD  07/09/2021  2:29 PM

## 2021-07-10 LAB — BETA HCG QUANT (REF LAB): hCG Quant: 24024 m[IU]/mL

## 2021-07-30 ENCOUNTER — Encounter: Payer: Self-pay | Admitting: *Deleted

## 2021-07-30 ENCOUNTER — Other Ambulatory Visit: Payer: Self-pay | Admitting: *Deleted

## 2021-07-30 ENCOUNTER — Telehealth: Payer: Self-pay | Admitting: *Deleted

## 2021-07-30 DIAGNOSIS — Z3481 Encounter for supervision of other normal pregnancy, first trimester: Secondary | ICD-10-CM

## 2021-07-30 MED ORDER — PROMETHAZINE HCL 25 MG PO TABS: 25.0000 mg | ORAL_TABLET | Freq: Four times a day (QID) | ORAL | 0 refills | Status: DC | PRN

## 2021-07-30 MED ORDER — PREPLUS 27-1 MG PO TABS: 1.0000 | ORAL_TABLET | Freq: Every day | ORAL | 13 refills | Status: AC

## 2021-07-30 NOTE — Telephone Encounter (Signed)
Returned TC to patient regarding report of pink tinged discharge in early pregnancy. Reports onset after intercourse. Reassured that this is normal for early pregnancy. Patient also requesting PNV and something for nausea. RX sent for phenergan and PNV per protocol.

## 2021-08-14 ENCOUNTER — Ambulatory Visit: Payer: Medicaid Other

## 2021-08-14 DIAGNOSIS — O09299 Supervision of pregnancy with other poor reproductive or obstetric history, unspecified trimester: Secondary | ICD-10-CM | POA: Insufficient documentation

## 2021-08-14 DIAGNOSIS — O09899 Supervision of other high risk pregnancies, unspecified trimester: Secondary | ICD-10-CM | POA: Insufficient documentation

## 2021-08-14 DIAGNOSIS — O09291 Supervision of pregnancy with other poor reproductive or obstetric history, first trimester: Secondary | ICD-10-CM

## 2021-08-14 NOTE — Progress Notes (Signed)
Patient was assessed and managed by nursing staff during this encounter. I have reviewed the chart and agree with the documentation and plan. I have also made any necessary editorial changes.  Catalina Antigua, MD 08/14/2021 4:13 PM

## 2021-08-14 NOTE — Progress Notes (Addendum)
New OB Intake  I connected with  Taylor Chang on 08/14/21 at  2:00 PM EDT by telephone Video Visit and verified that I am speaking with the correct person using two identifiers. Nurse is located at [facility] and pt is located at home.  I discussed the limitations, risks, security and privacy concerns of performing an evaluation and management service by telephone and the availability of in person appointments. I also discussed with the patient that there may be a patient responsible charge related to this service. The patient expressed understanding and agreed to proceed.  I explained I am completing New OB Intake today. We discussed her EDD of 03/02/22 that is based on LMP of 05/26/21. Pt is G6/P2. I reviewed her allergies, medications, Medical/Surgical/OB history, and appropriate screenings. I informed her of [facility] services.   Based on history, this is a/an  pregnancy complicated by hypertension and hx of stillbirth  . Pt states that she is to follow up with PCP for BP and possible refill on medication. Advised to contact our office if abnormal symptoms occur, or if PCP wants [place] to refill medication, and monitor BP at home, pt  agreed. Pt also reports that she is currently in counseling through outside services.     Concerns addressed today  Delivery Plans:  Plans to deliver at [facility].   MyChart/Babyscripts MyChart access verified. I explained pt will have some visits in office and some virtually. Babyscripts instructions given and order placed. Patient verifies receipt of registration text/e-mail. Account successfully created and app downloaded.  Blood Pressure Cuff  Blood pressure cuff ordered for patient to pick-up from [employer]. Explained after first prenatal appt pt will check weekly and document in Babyscripts.  Weight scale: Patient    have weight scale. Weight scale ordered for patient to pick up form [facility].   Anatomy US Explained first scheduled [place] will be  around 19 weeks. Anatomy US scheduled for: to be determined.Dr. Alysia Penna  Labs Discussed Avelina Laine genetic screening with patient. Would like both Panorama and Horizon drawn at new OB visit. Routine prenatal labs needed.  Covid Vaccine Patient has not covid vaccine.   Mother/ Baby Dyad Candidate?    If yes, offer as possibility  Informed patient of [facility] website  and placed link in her AVS.   Social Determinants of Health Food Insecurity: Patient denies food insecurity. WIC Referral: Patient is not interested in referral to [facility].  Transportation: Patient denies transportation needs. Childcare: Discussed no children allowed at ultrasound appointments. Offered childcare services; patient declines childcare services at this time.  Send link to Pregnancy Navigators   Placed OB Box on problem list and updated  First visit review I reviewed new OB appt with pt. I explained she will have a pelvic exam, ob bloodwork with genetic screening, and PAP smear. Explained pt will be seen by Dr. Alysia Penna at first visit; encounter routed to appropriate provider. Explained that patient will be seen by pregnancy navigator following visit with provider. [facility] information placed in AVS.   Katrina Stack, RN 08/14/2021  2:27 PM

## 2021-08-21 ENCOUNTER — Other Ambulatory Visit: Payer: Self-pay

## 2021-08-21 ENCOUNTER — Encounter: Payer: Self-pay | Admitting: Obstetrics and Gynecology

## 2021-08-21 ENCOUNTER — Ambulatory Visit (INDEPENDENT_AMBULATORY_CARE_PROVIDER_SITE_OTHER): Payer: Medicaid Other | Admitting: Obstetrics and Gynecology

## 2021-08-21 ENCOUNTER — Other Ambulatory Visit (HOSPITAL_COMMUNITY)
Admission: RE | Admit: 2021-08-21 | Discharge: 2021-08-21 | Disposition: A | Discharge status: Home / Self Care | Payer: Medicaid Other | Source: Ambulatory Visit | Attending: Obstetrics and Gynecology | Admitting: Obstetrics and Gynecology

## 2021-08-21 VITALS — BP 146/94 | HR 86 | Wt 167.8 lb

## 2021-08-21 DIAGNOSIS — O09291 Supervision of pregnancy with other poor reproductive or obstetric history, first trimester: Secondary | ICD-10-CM

## 2021-08-21 DIAGNOSIS — Z98891 History of uterine scar from previous surgery: Secondary | ICD-10-CM

## 2021-08-21 DIAGNOSIS — O09899 Supervision of other high risk pregnancies, unspecified trimester: Secondary | ICD-10-CM

## 2021-08-21 DIAGNOSIS — O10919 Unspecified pre-existing hypertension complicating pregnancy, unspecified trimester: Secondary | ICD-10-CM | POA: Insufficient documentation

## 2021-08-21 DIAGNOSIS — O10911 Unspecified pre-existing hypertension complicating pregnancy, first trimester: Secondary | ICD-10-CM | POA: Diagnosis not present

## 2021-08-21 DIAGNOSIS — O34211 Maternal care for low transverse scar from previous cesarean delivery: Secondary | ICD-10-CM | POA: Diagnosis not present

## 2021-08-21 DIAGNOSIS — O09891 Supervision of other high risk pregnancies, first trimester: Secondary | ICD-10-CM

## 2021-08-21 DIAGNOSIS — O09299 Supervision of pregnancy with other poor reproductive or obstetric history, unspecified trimester: Secondary | ICD-10-CM

## 2021-08-21 DIAGNOSIS — Z3A12 12 weeks gestation of pregnancy: Secondary | ICD-10-CM

## 2021-08-21 HISTORY — DX: Supervision of pregnancy with other poor reproductive or obstetric history, unspecified trimester: O09.299

## 2021-08-21 MED ORDER — ASPIRIN EC 81 MG PO TBEC: 81.0000 mg | DELAYED_RELEASE_TABLET | Freq: Every day | ORAL | 2 refills | Status: DC

## 2021-08-21 MED ORDER — LABETALOL HCL 200 MG PO TABS
200.0000 mg | ORAL_TABLET | Freq: Two times a day (BID) | ORAL | 3 refills | Status: DC
Start: 2021-08-21 — End: 2021-12-18

## 2021-08-21 NOTE — Progress Notes (Signed)
Subjective:  Taylor Chang is a 25 y.o. [phone] at 22w3dbeing seen today for her first OB visit. EDD by LMP and confirmed by first trimester U/S. H/O CHTN. H/O Fetal demise with last pregnancy due to placenta abruption, SPEC and DIC. H/O c section with last pregnancy as well.   She is currently monitored for the following issues for this high-risk pregnancy and has Supervision of other high risk pregnancy, antepartum; Chronic hypertension affecting pregnancy; Prior pregnancy with fetal demise and current pregnancy; and H/O cesarean section on their problem list.  Patient reports no complaints.   . Vag. Bleeding: None.   . Denies leaking of fluid.   The following portions of the patient's history were reviewed and updated as appropriate: allergies, current medications, past family history, past medical history, past social history, past surgical history and problem list. Problem list updated.  Objective:   Vitals:   08/21/21 1027  BP: (!) 146/94  Pulse: 86  Weight: 167 lb 12.8 oz (76.1 kg)    Fetal Status: Fetal Heart Rate (bpm): 158         General:  Alert, oriented and cooperative. Patient is in no acute distress.  Skin: Skin is warm and dry. No rash noted.   Cardiovascular: Normal heart rate noted  Respiratory: Normal respiratory effort, no problems with respiration noted  Abdomen: Soft, gravid, appropriate for gestational age. Pain/Pressure: Absent     Pelvic:  Cervical exam performed        Extremities: Normal range of motion.  Edema: None  Mental Status: Normal mood and affect. Normal behavior. Normal judgment and thought content.   Urinalysis:      Assessment and Plan:  Pregnancy: [id number] 126w3d1. Supervision of other high risk pregnancy, antepartum Prenatal care and labs reviewed with pt Genetic testing discussed - Comp Met (CMET) - Protein / creatinine ratio, urine - TSH - Hemoglobin A1c - Obstetric Panel, Including HIV - Genetic Screening - Hepatitis C  antibody - Culture, OB Urine - Cytology - PAP - Cervicovaginal ancillary only( [place])  2. Chronic hypertension affecting pregnancy Pt was taking Norvasc 2.5 mg but stopped Discussed CHTN and SPEC in pregnancy. Will start Labetalol. Qd BASA, indications reviewed with pt.  Return in 2 weeks for nurse visit for BP check Serial growth scans and weekly antenatal testing starting at 32 weeks reviewed with pt IOL at 39 weeks or for maternal/fetal indications - aspirin EC 81 MG tablet; Take 1 tablet (81 mg total) by mouth daily. Take after 12 weeks for prevention of preeclampsia later in pregnancy  Dispense: 300 tablet; Refill: 2 - labetalol (NORMODYNE) 200 MG tablet; Take 1 tablet (200 mg total) by mouth 2 (two) times daily.  Dispense: 60 tablet; Refill: 3  3. Prior pregnancy with fetal demise and current pregnancy in first trimester See above  4. H/O cesarean section To discuss TOLAC vs RLTCS at later OB visits  Preterm labor symptoms and general obstetric precautions including but not limited to vaginal bleeding, contractions, leaking of fluid and fetal movement were reviewed in detail with the patient. Please refer to After Visit Summary for other counseling recommendations.  Return in about 4 weeks (around 09/18/2021) for OB visit, face to face, MD only.   ErChancy MilroyMD

## 2021-08-21 NOTE — Patient Instructions (Signed)
First Trimester of Pregnancy °The first trimester of pregnancy starts on the first day of your last menstrual period until the end of week 12. This is months 1 through 3 of pregnancy. A week after a sperm fertilizes an egg, the egg will implant into the wall of the uterus and begin to develop into a baby. By the end of 12 weeks, all the baby's organs will be formed and the baby will be 2-3 inches in size. °Body changes during your first trimester °Your body goes through many changes during pregnancy. The changes vary and generally return to normal after your baby is born. °Physical changes °You may gain or lose weight. °Your breasts may begin to grow larger and become tender. The tissue that surrounds your nipples (areola) may become darker. °Dark spots or blotches (chloasma or mask of pregnancy) may develop on your face. °You may have changes in your hair. These can include thickening or thinning of your hair or changes in texture. °Health changes °You may feel nauseous, and you may vomit. °You may have heartburn. °You may develop headaches. °You may develop constipation. °Your gums may bleed and may be sensitive to brushing and flossing. °Other changes °You may tire easily. °You may urinate more often. °Your menstrual periods will stop. °You may have a loss of appetite. °You may develop cravings for certain kinds of food. °You may have changes in your emotions from day to day. °You may have more vivid and strange dreams. °Follow these instructions at home: °Medicines °Follow your health care provider's instructions regarding medicine use. Specific medicines may be either safe or unsafe to take during pregnancy. Do not take any medicines unless told to by your health care provider. °Take a prenatal vitamin that contains at least 600 micrograms (mcg) of folic acid. °Eating and drinking °Eat a healthy diet that includes fresh fruits and vegetables, whole grains, good sources of protein such as meat, eggs, or tofu,  and low-fat dairy products. °Avoid raw meat and unpasteurized juice, milk, and cheese. These carry germs that can harm you and your baby. °If you feel nauseous or you vomit: °Eat 4 or 5 small meals a day instead of 3 large meals. °Try eating a few soda crackers. °Drink liquids between meals instead of during meals. °You may need to take these actions to prevent or treat constipation: °Drink enough fluid to keep your urine pale yellow. °Eat foods that are high in fiber, such as beans, whole grains, and fresh fruits and vegetables. °Limit foods that are high in fat and processed sugars, such as fried or sweet foods. °Activity °Exercise only as directed by your health care provider. Most people can continue their usual exercise routine during pregnancy. Try to exercise for 30 minutes at least 5 days a week. °Stop exercising if you develop pain or cramping in the lower abdomen or lower back. °Avoid exercising if it is very hot or humid or if you are at high altitude. °Avoid heavy lifting. °If you choose to, you may have sex unless your health care provider tells you not to. °Relieving pain and discomfort °Wear a good support bra to relieve breast tenderness. °Rest with your legs elevated if you have leg cramps or low back pain. °If you develop bulging veins (varicose veins) in your legs: °Wear support hose as told by your health care provider. °Elevate your feet for 15 minutes, 3-4 times a day. °Limit salt in your diet. °Safety °Wear your seat belt at all times when driving   or riding in a car. °Talk with your health care provider if someone is verbally or physically abusive to you. °Talk with your health care provider if you are feeling sad or have thoughts of hurting yourself. °Lifestyle °Do not use hot tubs, steam rooms, or saunas. °Do not douche. Do not use tampons or scented sanitary pads. °Do not use herbal remedies, alcohol, illegal drugs, or medicines that are not approved by your health care provider. Chemicals  in these products can harm your baby. °Do not use any products that contain nicotine or tobacco, such as cigarettes, e-cigarettes, and chewing tobacco. If you need help quitting, ask your health care provider. °Avoid cat litter boxes and soil used by cats. These carry germs that can cause birth defects in the baby and possibly loss of the unborn baby (fetus) by miscarriage or stillbirth. °General instructions °During routine prenatal visits in the first trimester, your health care provider will do a physical exam, perform necessary tests, and ask you how things are going. Keep all follow-up visits. This is important. °Ask for help if you have counseling or nutritional needs during pregnancy. Your health care provider can offer advice or refer you to specialists for help with various needs. °Schedule a dentist appointment. At home, brush your teeth with a soft toothbrush. Floss gently. °Write down your questions. Take them to your prenatal visits. °Where to find more information °American Pregnancy Association: americanpregnancy.org °American College of Obstetricians and Gynecologists: acog.org/en/Womens%20Health/Pregnancy °Office on Women's Health: womenshealth.gov/pregnancy °Contact a health care provider if you have: °Dizziness. °A fever. °Mild pelvic cramps, pelvic pressure, or nagging pain in the abdominal area. °Nausea, vomiting, or diarrhea that lasts for 24 hours or longer. °A bad-smelling vaginal discharge. °Pain when you urinate. °Known exposure to a contagious illness, such as chickenpox, measles, Zika virus, HIV, or hepatitis. °Get help right away if you have: °Spotting or bleeding from your vagina. °Severe abdominal cramping or pain. °Shortness of breath or chest pain. °Any kind of trauma, such as from a fall or a car crash. °New or increased pain, swelling, or redness in an arm or leg. °Summary °The first trimester of pregnancy starts on the first day of your last menstrual period until the end of week  12 (months 1 through 3). °Eating 4 or 5 small meals a day rather than 3 large meals may help to relieve nausea and vomiting. °Do not use any products that contain nicotine or tobacco, such as cigarettes, e-cigarettes, and chewing tobacco. If you need help quitting, ask your health care provider. °Keep all follow-up visits. This is important. °This information is not intended to replace advice given to you by your health care provider. Make sure you discuss any questions you have with your health care provider. °Document Revised: 04/10/2020 Document Reviewed: 02/15/2020 °Elsevier Patient Education © 2022 Elsevier Inc. ° °

## 2021-08-21 NOTE — Progress Notes (Signed)
New OB 12.3 wks OB Panel, OB urine, GC/CC, PAP today Genetic screening offered and accepted Depression and anxiety screen negative. Recent IUFD.

## 2021-08-22 LAB — COMPREHENSIVE METABOLIC PANEL
ALT: 7 IU/L (ref 0–32)
AST: 11 IU/L (ref 0–40)
Albumin/Globulin Ratio: 1.3 (ref 1.2–2.2)
Albumin: 4.2 g/dL (ref 3.9–5.0)
Alkaline Phosphatase: 47 IU/L (ref 44–121)
BUN/Creatinine Ratio: 12 (ref 9–23)
BUN: 9 mg/dL (ref 6–20)
Bilirubin Total: <0.2 mg/dL (ref 0.0–1.2)
CO2: 21 mmol/L (ref 20–29)
Calcium: 9.1 mg/dL (ref 8.7–10.2)
Chloride: 102 mmol/L (ref 96–106)
Creatinine, Ser: 0.73 mg/dL (ref 0.57–1.00)
Globulin, Total: 3.2 g/dL (ref 1.5–4.5)
Glucose: 68 mg/dL — ABNORMAL LOW (ref 70–99)
Potassium: 3.8 mmol/L (ref 3.5–5.2)
Sodium: 136 mmol/L (ref 134–144)
Total Protein: 7.4 g/dL (ref 6.0–8.5)
eGFR: 116 mL/min/{1.73_m2} (ref >59)

## 2021-08-22 LAB — PROTEIN / CREATININE RATIO, URINE
Creatinine, Urine: 261.8 mg/dL
Protein, Ur: 32.1 mg/dL
Protein/Creat Ratio: 123 mg/g creat (ref 0–200)

## 2021-08-22 LAB — OBSTETRIC PANEL, INCLUDING HIV
Basophils Absolute: 0 10*3/uL (ref 0.0–0.2)
Basos: 0 %
EOS (ABSOLUTE): 0.1 10*3/uL (ref 0.0–0.4)
Eos: 1 %
HIV Screen 4th Generation wRfx: NONREACTIVE
Hematocrit: 32.8 % — ABNORMAL LOW (ref 34.0–46.6)
Hemoglobin: 10.2 g/dL — ABNORMAL LOW (ref 11.1–15.9)
Hepatitis B Surface Ag: NEGATIVE
Immature Grans (Abs): 0 10*3/uL (ref 0.0–0.1)
Immature Granulocytes: 0 %
Lymphocytes Absolute: 1.6 10*3/uL (ref 0.7–3.1)
Lymphs: 27 %
MCH: 28.4 pg (ref 26.6–33.0)
MCHC: 31.1 g/dL — ABNORMAL LOW (ref 31.5–35.7)
MCV: 91 fL (ref 79–97)
Monocytes Absolute: 0.6 10*3/uL (ref 0.1–0.9)
Monocytes: 10 %
Neutrophils Absolute: 3.6 10*3/uL (ref 1.4–7.0)
Neutrophils: 62 %
Platelets: 326 10*3/uL (ref 150–450)
RBC: 3.59 x10E6/uL — ABNORMAL LOW (ref 3.77–5.28)
RDW: 14.8 % (ref 11.7–15.4)
RPR Ser Ql: NONREACTIVE
Rh Factor: NEGATIVE
Rubella Antibodies, IGG: 1.11 index (ref >0.99)
WBC: 5.8 10*3/uL (ref 3.4–10.8)

## 2021-08-22 LAB — AB SCR+ANTIBODY ID: Antibody Screen: POSITIVE — AB

## 2021-08-22 LAB — CERVICOVAGINAL ANCILLARY ONLY
Chlamydia: NEGATIVE
Comment: NEGATIVE
Comment: NORMAL
Neisseria Gonorrhea: NEGATIVE

## 2021-08-22 LAB — HEMOGLOBIN A1C
Est. average glucose Bld gHb Est-mCnc: 105 mg/dL
Hgb A1c MFr Bld: 5.3 % (ref 4.8–5.6)

## 2021-08-22 LAB — TSH: TSH: 0.502 u[IU]/mL (ref 0.450–4.500)

## 2021-08-22 LAB — HEPATITIS C ANTIBODY: Hep C Virus Ab: <0.1 s/co ratio (ref 0.0–0.9)

## 2021-08-23 LAB — CYTOLOGY - PAP: Diagnosis: UNDETERMINED — AB

## 2021-08-26 LAB — CULTURE, OB URINE

## 2021-08-26 LAB — URINE CULTURE, OB REFLEX

## 2021-08-28 ENCOUNTER — Encounter: Payer: Self-pay | Admitting: Obstetrics and Gynecology

## 2021-09-04 ENCOUNTER — Other Ambulatory Visit: Payer: Self-pay

## 2021-09-04 ENCOUNTER — Ambulatory Visit (INDEPENDENT_AMBULATORY_CARE_PROVIDER_SITE_OTHER): Payer: Medicaid Other

## 2021-09-04 ENCOUNTER — Encounter: Payer: Self-pay | Admitting: Obstetrics and Gynecology

## 2021-09-04 DIAGNOSIS — O10919 Unspecified pre-existing hypertension complicating pregnancy, unspecified trimester: Secondary | ICD-10-CM

## 2021-09-04 NOTE — Progress Notes (Deleted)
Subjective:  Taylor Chang is a 25 y.o. female here for BP check.   Hypertension ROS: taking medications as instructed, no medication side effects noted, no TIA's, no chest pain on exertion, no dyspnea on exertion, and no swelling of ankles.    Objective:  LMP 05/26/2021   Appearance alert, well appearing, and in no distress. General exam BP noted to be well controlled today in office.    Assessment:   Blood Pressure well controlled.   Plan:  Current treatment plan is effective, no change in therapy.Marland Kitchen

## 2021-09-04 NOTE — Progress Notes (Signed)
Subjective:  Taylor Chang is a 25 y.o. female here for BP check.   Hypertension ROS: taking medications as instructed, no medication side effects noted, no TIA's, no chest pain on exertion, no dyspnea on exertion, and no swelling of ankles.    Objective:  BP 135/82   Pulse 89   LMP 05/26/2021   Appearance alert, well appearing, and in no distress. General exam BP noted to be well controlled today in office.    Assessment:   Blood Pressure well controlled.   Plan:  Current treatment plan is effective, no change in therapy.Marland Kitchen

## 2021-09-08 NOTE — Progress Notes (Signed)
Patient was assessed and managed by nursing staff during this encounter. I have reviewed the chart and agree with the documentation and plan. I have also made any necessary editorial changes.  Catalina Antigua, MD 09/08/2021 10:52 AM

## 2021-09-18 ENCOUNTER — Other Ambulatory Visit: Payer: Self-pay

## 2021-09-18 ENCOUNTER — Ambulatory Visit (INDEPENDENT_AMBULATORY_CARE_PROVIDER_SITE_OTHER): Payer: Medicaid Other | Admitting: Obstetrics and Gynecology

## 2021-09-18 ENCOUNTER — Encounter: Payer: Self-pay | Admitting: Obstetrics and Gynecology

## 2021-09-18 VITALS — BP 134/84 | HR 89 | Wt 167.0 lb

## 2021-09-18 DIAGNOSIS — O09292 Supervision of pregnancy with other poor reproductive or obstetric history, second trimester: Secondary | ICD-10-CM

## 2021-09-18 DIAGNOSIS — R87619 Unspecified abnormal cytological findings in specimens from cervix uteri: Secondary | ICD-10-CM

## 2021-09-18 DIAGNOSIS — Z98891 History of uterine scar from previous surgery: Secondary | ICD-10-CM

## 2021-09-18 DIAGNOSIS — O10919 Unspecified pre-existing hypertension complicating pregnancy, unspecified trimester: Secondary | ICD-10-CM

## 2021-09-18 DIAGNOSIS — O09899 Supervision of other high risk pregnancies, unspecified trimester: Secondary | ICD-10-CM

## 2021-09-18 NOTE — Progress Notes (Signed)
   PRENATAL VISIT NOTE  Subjective:  Taylor Chang is a 25 y.o. [phone] at [redacted]w[redacted]d being seen today for ongoing prenatal care.  She is currently monitored for the following issues for this high-risk pregnancy and has Supervision of other high risk pregnancy, antepartum; Chronic hypertension affecting pregnancy; Prior pregnancy with fetal demise and current pregnancy; H/O cesarean section; and Atypical glandular cells of undetermined significance (AGUS) on cervical Pap smear on their problem list.  Patient reports no complaints.  Contractions: Not present. Vag. Bleeding: None.  Movement: Present. Denies leaking of fluid.   The following portions of the patient's history were reviewed and updated as appropriate: allergies, current medications, past family history, past medical history, past social history, past surgical history and problem list.   Objective:   Vitals:   09/18/21 1029  BP: 134/84  Pulse: 89  Weight: 167 lb (75.8 kg)    Fetal Status: Fetal Heart Rate (bpm): 155   Movement: Present     General:  Alert, oriented and cooperative. Patient is in no acute distress.  Skin: Skin is warm and dry. No rash noted.   Cardiovascular: Normal heart rate noted  Respiratory: Normal respiratory effort, no problems with respiration noted  Abdomen: Soft, gravid, appropriate for gestational age.  Pain/Pressure: Absent     Pelvic: Cervical exam deferred        Extremities: Normal range of motion.     Mental Status: Normal mood and affect. Normal behavior. Normal judgment and thought content.   Assessment and Plan:  Pregnancy: [id number] at [redacted]w[redacted]d 1. Supervision of other high risk pregnancy, antepartum Patient is doing well  Anatomy ultrasound scheduled    2. Chronic hypertension affecting pregnancy Continue labetalol and ASA  3. Prior pregnancy with fetal demise and current pregnancy in second trimester Plan for delivery by 39 weeks  4. H/O cesarean section Information on TOLAC vs RCS  provided  5. Atypical glandular cells of undetermined significance (AGUS) on cervical Pap smear colpo today See separate note  Preterm labor symptoms and general obstetric precautions including but not limited to vaginal bleeding, contractions, leaking of fluid and fetal movement were reviewed in detail with the patient. Please refer to After Visit Summary for other counseling recommendations.   Return in about 4 weeks (around 10/16/2021) for in person, ROB, High risk.  Future Appointments  Date Time Provider Department Center  10/06/2021 10:30 AM [facility] NURSE [facility]  10/06/2021 10:45 AM [facility] US5 [facility]    Catalina Antigua, MD

## 2021-09-18 NOTE — Progress Notes (Signed)
Patient with ASCUS and AGUS pap smear here for colposcopy Patient given informed consent, signed copy in the chart, time out was performed.  Placed in lithotomy position. Cervix viewed with speculum and colposcope after application of acetic acid.   Colposcopy adequate?  yes Acetowhite lesions?no Punctation?no Mosaicism?  no Abnormal vasculature?  no Biopsies?no ECC?no  COMMENTS: Patient was given post procedure instructions.  Repeat pp  Catalina Antigua, MD

## 2021-09-20 LAB — AFP, SERUM, OPEN SPINA BIFIDA
AFP MoM: 1.07
AFP Value: 40.1 ng/mL
Gest. Age on Collection Date: 16.3 weeks
Maternal Age At EDD: 26.9 yr
OSBR Risk 1 IN: 10000
Test Results:: NEGATIVE
Weight: 167 [lb_av]

## 2021-10-06 ENCOUNTER — Ambulatory Visit: Payer: Medicaid Other | Attending: Obstetrics and Gynecology

## 2021-10-06 ENCOUNTER — Ambulatory Visit: Payer: Medicaid Other | Admitting: *Deleted

## 2021-10-06 ENCOUNTER — Ambulatory Visit (HOSPITAL_BASED_OUTPATIENT_CLINIC_OR_DEPARTMENT_OTHER): Payer: Medicaid Other | Admitting: Obstetrics

## 2021-10-06 ENCOUNTER — Other Ambulatory Visit: Payer: Self-pay | Admitting: *Deleted

## 2021-10-06 ENCOUNTER — Other Ambulatory Visit: Payer: Self-pay

## 2021-10-06 ENCOUNTER — Encounter: Payer: Self-pay | Admitting: *Deleted

## 2021-10-06 VITALS — BP 136/83 | HR 91

## 2021-10-06 DIAGNOSIS — Z3A19 19 weeks gestation of pregnancy: Secondary | ICD-10-CM | POA: Insufficient documentation

## 2021-10-06 DIAGNOSIS — O361120 Maternal care for Anti-A sensitization, second trimester, not applicable or unspecified: Secondary | ICD-10-CM

## 2021-10-06 DIAGNOSIS — R87619 Unspecified abnormal cytological findings in specimens from cervix uteri: Secondary | ICD-10-CM

## 2021-10-06 DIAGNOSIS — O10919 Unspecified pre-existing hypertension complicating pregnancy, unspecified trimester: Secondary | ICD-10-CM

## 2021-10-06 DIAGNOSIS — O09291 Supervision of pregnancy with other poor reproductive or obstetric history, first trimester: Secondary | ICD-10-CM | POA: Diagnosis not present

## 2021-10-06 DIAGNOSIS — O09899 Supervision of other high risk pregnancies, unspecified trimester: Secondary | ICD-10-CM

## 2021-10-06 DIAGNOSIS — O09292 Supervision of pregnancy with other poor reproductive or obstetric history, second trimester: Secondary | ICD-10-CM | POA: Insufficient documentation

## 2021-10-06 DIAGNOSIS — Z8759 Personal history of other complications of pregnancy, childbirth and the puerperium: Secondary | ICD-10-CM

## 2021-10-06 DIAGNOSIS — O10912 Unspecified pre-existing hypertension complicating pregnancy, second trimester: Secondary | ICD-10-CM | POA: Insufficient documentation

## 2021-10-06 NOTE — Progress Notes (Signed)
MFM Note  Taylor Chang was seen for a detailed ultrasound exam due to chronic hypertension that is treated with labetalol 200 mg twice a day.  She reports that she has been hypertensive since her pregnancy last year.  Her baseline PIH labs and P/C ratio were all within normal limits.    The patient reports that her last pregnancy in January 2022 where she delivered at [facility], was complicated by a fetal demise most likely secondary to placental abruption at 37+ weeks.  She underwent an emergency C-section and was transfused multiple units of packed red blood cells due to significant hemorrhage.  She has screened positive for anti-Lewis antibodies in her current pregnancy.  Her blood type is O-.  She denies any problems in her current pregnancy.    She had a cell free DNA test earlier in her pregnancy which indicated a low risk for trisomy 54, 47, and 13. A female fetus is predicted.   She was informed that the fetal growth and amniotic fluid level were appropriate for her gestational age.   On today's exam, an intracardiac echogenic focus was noted in the left ventricle of the fetal heart.  The small association between an echogenic focus and Down syndrome was discussed. Due to the echogenic focus noted today, the patient was offered and declined an amniocentesis today for definitive diagnosis of fetal aneuploidy.  She reports that she is comfortable with her negative cell free DNA test.  The patient was informed that anomalies may be missed due to technical limitations. If the fetus is in a suboptimal position or maternal habitus is increased, visualization of the fetus in the maternal uterus may be impaired.  The following were discussed today:  Chronic hypertension in pregnancy  The implications and management of chronic hypertension in pregnancy was discussed.  She was advised to continue taking labetalol for blood pressure control throughout her pregnancy.    The  patient was advised that should her blood pressures be elevated later in her pregnancy, the dosage of her antihypertensive medications may need to be increased.    The increased risk of superimposed preeclampsia, an indicated preterm delivery, and possible fetal growth restriction due to chronic hypertension in pregnancy was discussed.   We will continue to follow her with monthly growth scans.   Weekly fetal testing should be started at around 32 weeks.   To decrease her risk of superimposed preeclampsia, she should continue taking a daily baby aspirin (81 mg daily) for preeclampsia prophylaxis.   Prior fetal demise at 37+ weeks possibly due to placental abruption  The patient was advised that maintaining normal blood pressure control may help decrease her risk of placental abruption.  Due to her prior demise and chronic hypertension, weekly fetal testing should be started at 32 weeks and continued until delivery.  To decrease her risk of another adverse pregnancy outcome, delivery may be considered at between 37 to 38 weeks.  Positive anti-Lewis antibodies  Anti-Lewis antibodies are of the IgM class and will therefore not cross the placenta.  It should not cause fetal anemia.  The patient was advised that she may have been exposed to the Lewis antigen from her prior blood transfusion or from her prior pregnancies.    She may have the father of the baby tested to determine if he carries the Lewis antigen on his red blood cells if she wants.  Whether or not he is screened will not make a difference in the management of  her pregnancy.  A follow-up exam was scheduled in 4 weeks.    The patient stated that all of her questions have been answered.  A total of 45 minutes was spent counseling and coordinating the care for this patient.  Greater than 50% of the time was spent in direct face-to-face contact.  Recommendations:  Continue labetalol for blood pressure control Continue daily  baby aspirin for preeclampsia prophylaxis  Monthly growth ultrasounds Start weekly fetal testing at 32 weeks Delivery at between 37 to 38 weeks

## 2021-10-16 ENCOUNTER — Encounter: Payer: Self-pay | Admitting: Obstetrics and Gynecology

## 2021-10-16 ENCOUNTER — Other Ambulatory Visit: Payer: Self-pay

## 2021-10-16 ENCOUNTER — Ambulatory Visit (INDEPENDENT_AMBULATORY_CARE_PROVIDER_SITE_OTHER): Payer: Medicaid Other | Admitting: Obstetrics and Gynecology

## 2021-10-16 VITALS — BP 135/80 | HR 87 | Wt 179.0 lb

## 2021-10-16 DIAGNOSIS — O10919 Unspecified pre-existing hypertension complicating pregnancy, unspecified trimester: Secondary | ICD-10-CM

## 2021-10-16 DIAGNOSIS — O09899 Supervision of other high risk pregnancies, unspecified trimester: Secondary | ICD-10-CM

## 2021-10-16 DIAGNOSIS — Z98891 History of uterine scar from previous surgery: Secondary | ICD-10-CM

## 2021-10-16 DIAGNOSIS — O09292 Supervision of pregnancy with other poor reproductive or obstetric history, second trimester: Secondary | ICD-10-CM

## 2021-10-16 NOTE — Progress Notes (Signed)
   PRENATAL VISIT NOTE  Subjective:  Taylor Chang is a 25 y.o. [id number] at [redacted]w[redacted]d being seen today for ongoing prenatal care.  She is currently monitored for the following issues for this high-risk pregnancy and has Supervision of other high risk pregnancy, antepartum; Chronic hypertension affecting pregnancy; Prior pregnancy with fetal demise and current pregnancy; H/O cesarean section; and Atypical glandular cells of undetermined significance (AGUS) on cervical Pap smear on their problem list.  Patient reports no complaints.  Contractions: Not present. Vag. Bleeding: None.  Movement: Present. Denies leaking of fluid.   The following portions of the patient's history were reviewed and updated as appropriate: allergies, current medications, past family history, past medical history, past social history, past surgical history and problem list.   Objective:   Vitals:   10/16/21 1116  BP: 135/80  Pulse: 87  Weight: 179 lb (81.2 kg)    Fetal Status: Fetal Heart Rate (bpm): 145 Fundal Height: 20 cm Movement: Present     General:  Alert, oriented and cooperative. Patient is in no acute distress.  Skin: Skin is warm and dry. No rash noted.   Cardiovascular: Normal heart rate noted  Respiratory: Normal respiratory effort, no problems with respiration noted  Abdomen: Soft, gravid, appropriate for gestational age.  Pain/Pressure: Absent     Pelvic: Cervical exam deferred        Extremities: Normal range of motion.     Mental Status: Normal mood and affect. Normal behavior. Normal judgment and thought content.   Assessment and Plan:  Pregnancy: [id number] at [redacted]w[redacted]d 1. Supervision of other high risk pregnancy, antepartum Patient is doing well without complaints  2. Chronic hypertension affecting pregnancy BP stable Continue labetalol and ASA Follow up growth ultrasound 12/19  3. H/O cesarean section Undecided on TOLAC vs RCS  4. Prior pregnancy with fetal demise and current pregnancy in  second trimester Plan for delivery at 37-38 weeks  Preterm labor symptoms and general obstetric precautions including but not limited to vaginal bleeding, contractions, leaking of fluid and fetal movement were reviewed in detail with the patient. Please refer to After Visit Summary for other counseling recommendations.   Return in about 4 weeks (around 11/13/2021) for in person, ROB, High risk.  Future Appointments  Date Time Provider Department Center  11/03/2021 10:30 AM [facility] NURSE [facility]  11/03/2021 10:45 AM [facility] US4 [facility]    Catalina Antigua, MD

## 2021-11-03 ENCOUNTER — Ambulatory Visit: Payer: Medicaid Other

## 2021-11-03 ENCOUNTER — Encounter: Payer: Self-pay | Admitting: *Deleted

## 2021-11-03 ENCOUNTER — Ambulatory Visit: Payer: Medicaid Other | Admitting: *Deleted

## 2021-11-03 ENCOUNTER — Other Ambulatory Visit: Payer: Self-pay

## 2021-11-03 ENCOUNTER — Ambulatory Visit: Payer: Medicaid Other | Attending: Obstetrics

## 2021-11-03 VITALS — BP 136/71 | HR 84

## 2021-11-03 DIAGNOSIS — Z3A23 23 weeks gestation of pregnancy: Secondary | ICD-10-CM

## 2021-11-03 DIAGNOSIS — O09292 Supervision of pregnancy with other poor reproductive or obstetric history, second trimester: Secondary | ICD-10-CM | POA: Diagnosis not present

## 2021-11-03 DIAGNOSIS — R87619 Unspecified abnormal cytological findings in specimens from cervix uteri: Secondary | ICD-10-CM | POA: Diagnosis present

## 2021-11-03 DIAGNOSIS — O10919 Unspecified pre-existing hypertension complicating pregnancy, unspecified trimester: Secondary | ICD-10-CM | POA: Diagnosis present

## 2021-11-03 DIAGNOSIS — O34219 Maternal care for unspecified type scar from previous cesarean delivery: Secondary | ICD-10-CM

## 2021-11-03 DIAGNOSIS — Z8759 Personal history of other complications of pregnancy, childbirth and the puerperium: Secondary | ICD-10-CM | POA: Diagnosis present

## 2021-11-03 DIAGNOSIS — O10012 Pre-existing essential hypertension complicating pregnancy, second trimester: Secondary | ICD-10-CM

## 2021-11-04 ENCOUNTER — Other Ambulatory Visit: Payer: Self-pay | Admitting: *Deleted

## 2021-11-04 DIAGNOSIS — O10912 Unspecified pre-existing hypertension complicating pregnancy, second trimester: Secondary | ICD-10-CM

## 2021-11-04 DIAGNOSIS — O34219 Maternal care for unspecified type scar from previous cesarean delivery: Secondary | ICD-10-CM

## 2021-11-04 DIAGNOSIS — Z8759 Personal history of other complications of pregnancy, childbirth and the puerperium: Secondary | ICD-10-CM

## 2021-11-04 DIAGNOSIS — O09299 Supervision of pregnancy with other poor reproductive or obstetric history, unspecified trimester: Secondary | ICD-10-CM

## 2021-11-13 ENCOUNTER — Ambulatory Visit (INDEPENDENT_AMBULATORY_CARE_PROVIDER_SITE_OTHER): Payer: Medicaid Other | Admitting: Obstetrics and Gynecology

## 2021-11-13 ENCOUNTER — Other Ambulatory Visit: Payer: Self-pay

## 2021-11-13 VITALS — BP 136/70 | HR 81 | Wt 185.0 lb

## 2021-11-13 DIAGNOSIS — Z3A24 24 weeks gestation of pregnancy: Secondary | ICD-10-CM

## 2021-11-13 DIAGNOSIS — O10919 Unspecified pre-existing hypertension complicating pregnancy, unspecified trimester: Secondary | ICD-10-CM

## 2021-11-13 DIAGNOSIS — Z98891 History of uterine scar from previous surgery: Secondary | ICD-10-CM

## 2021-11-13 DIAGNOSIS — O09292 Supervision of pregnancy with other poor reproductive or obstetric history, second trimester: Secondary | ICD-10-CM

## 2021-11-13 DIAGNOSIS — O09899 Supervision of other high risk pregnancies, unspecified trimester: Secondary | ICD-10-CM

## 2021-11-13 NOTE — Progress Notes (Signed)
+   Fetal movement. No complaints.  

## 2021-11-13 NOTE — Progress Notes (Signed)
° °  PRENATAL VISIT NOTE  Subjective:  Taylor Chang is a 25 y.o. [id number] at [redacted]w[redacted]d being seen today for ongoing prenatal care.  She is currently monitored for the following issues for this high-risk pregnancy and has Supervision of other high risk pregnancy, antepartum; Chronic hypertension affecting pregnancy; Prior pregnancy with fetal demise and current pregnancy; H/O cesarean section; Atypical glandular cells of undetermined significance (AGUS) on cervical Pap smear; and [redacted] weeks gestation of pregnancy on their problem list.  Patient doing well with no acute concerns today. She reports no complaints.  Contractions: Not present. Vag. Bleeding: None.  Movement: Present. Denies leaking of fluid.   The following portions of the patient's history were reviewed and updated as appropriate: allergies, current medications, past family history, past medical history, past social history, past surgical history and problem list. Problem list updated.  Objective:   Vitals:   11/13/21 1130 11/13/21 1136  BP: (!) 145/82 136/70  Pulse: 85 81  Weight: 185 lb (83.9 kg)     Fetal Status: Fetal Heart Rate (bpm): 150 Fundal Height: 24 cm Movement: Present     General:  Alert, oriented and cooperative. Patient is in no acute distress.  Skin: Skin is warm and dry. No rash noted.   Cardiovascular: Normal heart rate noted  Respiratory: Normal respiratory effort, no problems with respiration noted  Abdomen: Soft, gravid, appropriate for gestational age.  Pain/Pressure: Absent     Pelvic: Cervical exam deferred        Extremities: Normal range of motion.  Edema: None  Mental Status:  Normal mood and affect. Normal behavior. Normal judgment and thought content.   Assessment and Plan:  Pregnancy: [id number] at [redacted]w[redacted]d  1. [redacted] weeks gestation of pregnancy   2. Chronic hypertension affecting pregnancy BP WNL with current regimen  3. Supervision of other high risk pregnancy, antepartum Continue routine  care Discussed VBAC versus repeat cesarean section Pt had c/s for abruption on 12/08/20, short interval between pregnancies does give pause regarding VBAC Pt given info sheet on VBAC and will hopefully have decision at next visit She currently is leaning toward repeat c section  4. Prior pregnancy with fetal demise and current pregnancy in second trimester Monitoring per MFM, u/s scheduled for 12/08/21, per MFM delivery at 37-38 weeks, pt prefers 37 weeks  5. H/O cesarean section See above  Preterm labor symptoms and general obstetric precautions including but not limited to vaginal bleeding, contractions, leaking of fluid and fetal movement were reviewed in detail with the patient.  Please refer to After Visit Summary for other counseling recommendations.   Return in about 3 weeks (around 12/04/2021) for ROB, in person, 2 hr GTT, 3rd trim labs.   Mariel Aloe, MD Faculty Attending Center for [facility]

## 2021-12-03 ENCOUNTER — Other Ambulatory Visit: Payer: Medicaid Other

## 2021-12-03 ENCOUNTER — Other Ambulatory Visit: Payer: Self-pay

## 2021-12-03 ENCOUNTER — Ambulatory Visit (INDEPENDENT_AMBULATORY_CARE_PROVIDER_SITE_OTHER): Payer: Medicaid Other | Admitting: Family Medicine

## 2021-12-03 ENCOUNTER — Encounter: Payer: Self-pay | Admitting: Family Medicine

## 2021-12-03 VITALS — BP 122/75 | HR 80 | Wt 192.0 lb

## 2021-12-03 DIAGNOSIS — O26899 Other specified pregnancy related conditions, unspecified trimester: Secondary | ICD-10-CM

## 2021-12-03 DIAGNOSIS — Z3A27 27 weeks gestation of pregnancy: Secondary | ICD-10-CM | POA: Diagnosis not present

## 2021-12-03 DIAGNOSIS — O360120 Maternal care for anti-D [Rh] antibodies, second trimester, not applicable or unspecified: Secondary | ICD-10-CM

## 2021-12-03 DIAGNOSIS — O09292 Supervision of pregnancy with other poor reproductive or obstetric history, second trimester: Secondary | ICD-10-CM

## 2021-12-03 DIAGNOSIS — O09899 Supervision of other high risk pregnancies, unspecified trimester: Secondary | ICD-10-CM

## 2021-12-03 DIAGNOSIS — Z98891 History of uterine scar from previous surgery: Secondary | ICD-10-CM

## 2021-12-03 DIAGNOSIS — Z6791 Unspecified blood type, Rh negative: Secondary | ICD-10-CM

## 2021-12-03 DIAGNOSIS — O10919 Unspecified pre-existing hypertension complicating pregnancy, unspecified trimester: Secondary | ICD-10-CM

## 2021-12-03 MED ORDER — RHO D IMMUNE GLOBULIN 1500 UNIT/2ML IJ SOSY
300.0000 ug | PREFILLED_SYRINGE | Freq: Once | INTRAMUSCULAR | Status: AC
  Administered 2021-12-03: 300 ug via INTRAMUSCULAR

## 2021-12-03 NOTE — Progress Notes (Addendum)
ROB/GTT.  Declined TDAP vaccine.  Rhogam given in RUOQ, tolerated well.  Administrations This Visit     rho (d) immune globulin (RHIG/RHOPHYLAC) injection 300 mcg     Admin Date 12/03/2021 Action Given Dose 300 mcg Route Intramuscular Administered By Maretta Bees, RMA

## 2021-12-03 NOTE — Progress Notes (Signed)
° °  PRENATAL VISIT NOTE  Subjective:  Taylor Chang is a 26 y.o. [id number] at [redacted]w[redacted]d being seen today for ongoing prenatal care.  She is currently monitored for the following issues for this high-risk pregnancy and has Supervision of other high risk pregnancy, antepartum; Chronic hypertension affecting pregnancy; Prior pregnancy with fetal demise and current pregnancy; H/O cesarean section; and Atypical glandular cells of undetermined significance (AGUS) on cervical Pap smear on their problem list.  Patient reports no complaints.  Contractions: Not present. Vag. Bleeding: None.  Movement: Present. Denies leaking of fluid.   The following portions of the patient's history were reviewed and updated as appropriate: allergies, current medications, past family history, past medical history, past social history, past surgical history and problem list.   Objective:   Vitals:   12/03/21 0913  BP: 122/75  Pulse: 80  Weight: 192 lb (87.1 kg)    Fetal Status: Fetal Heart Rate (bpm): 144   Movement: Present  General:  Alert, oriented and cooperative. Patient is in no acute distress.  Skin: Skin is warm and dry. No rash noted.   Cardiovascular: Normal heart rate noted.  Respiratory: Normal respiratory effort, no problems with respiration noted.  Abdomen: Soft, gravid, appropriate for gestational age. Fundal height 27 cm.  Pain/Pressure: Absent.     Pelvic: Cervical exam deferred.  Extremities: Normal range of motion.  Edema: None.  Mental Status: Normal mood and affect. Normal behavior. Normal judgment and thought content.   Assessment and Plan:  Pregnancy: [id number] at [redacted]w[redacted]d  1. Supervision of other high risk pregnancy, antepartum 2. [redacted] weeks gestation of pregnancy Progressing well. FH and FHT within normal limits. 28 week labs obtained today, Tdap declined. Rhogam given. Follow up in 2 weeks for next OB visit.  - Glucose Tolerance, 2 Hours w/1 Hour - RPR - CBC - HIV antibody (with reflex) -  rho (d) immune globulin (RHIG/RHOPHYLAC) injection 300 mcg  3. Chronic hypertension affecting pregnancy Stable. BP at goal today. Compliant with Labetalol 200 mg BID with ASA daily. No symptoms. Will continue to monitor.   4. H/O cesarean section 5. Prior pregnancy with fetal demise and current pregnancy in second trimester Due to placental abruption. Planning for delivery at 37-38 weeks.   6. Rh negative state in antepartum period Rhogam given today without complication.  - rho (d) immune globulin (RHIG/RHOPHYLAC) injection 300 mcg  Preterm labor symptoms and general obstetric precautions including but not limited to vaginal bleeding, contractions, leaking of fluid and fetal movement were reviewed in detail with the patient.  Please refer to After Visit Summary for other counseling recommendations.   Return in about 2 weeks (around 12/17/2021) for follow up HR OB visit.  Future Appointments  Date Time Provider [place]  12/08/2021  9:15 AM [facility] NURSE [facility]  12/08/2021  9:30 AM [facility] US3 [facility]  12/17/2021  9:55 AM Chancy Milroy, MD [facility] None   Genia Del, MD

## 2021-12-04 ENCOUNTER — Other Ambulatory Visit: Payer: Self-pay | Admitting: Family Medicine

## 2021-12-04 DIAGNOSIS — D509 Iron deficiency anemia, unspecified: Secondary | ICD-10-CM

## 2021-12-04 DIAGNOSIS — O99019 Anemia complicating pregnancy, unspecified trimester: Secondary | ICD-10-CM

## 2021-12-04 LAB — GLUCOSE TOLERANCE, 2 HOURS W/ 1HR
Glucose, 1 hour: 77 mg/dL (ref 70–179)
Glucose, 2 hour: 81 mg/dL (ref 70–152)
Glucose, Fasting: 71 mg/dL (ref 70–91)

## 2021-12-04 LAB — RPR: RPR Ser Ql: NONREACTIVE

## 2021-12-04 LAB — CBC
Hematocrit: 30 % — ABNORMAL LOW (ref 34.0–46.6)
Hemoglobin: 10.3 g/dL — ABNORMAL LOW (ref 11.1–15.9)
MCH: 32.2 pg (ref 26.6–33.0)
MCHC: 34.3 g/dL (ref 31.5–35.7)
MCV: 94 fL (ref 79–97)
Platelets: 257 10*3/uL (ref 150–450)
RBC: 3.2 x10E6/uL — ABNORMAL LOW (ref 3.77–5.28)
RDW: 12.8 % (ref 11.7–15.4)
WBC: 7.9 10*3/uL (ref 3.4–10.8)

## 2021-12-04 LAB — HIV ANTIBODY (ROUTINE TESTING W REFLEX): HIV Screen 4th Generation wRfx: NONREACTIVE

## 2021-12-04 MED ORDER — IRON (FERROUS SULFATE) 325 (65 FE) MG PO TABS: 1.0000 | ORAL_TABLET | ORAL | 2 refills | Status: AC

## 2021-12-08 ENCOUNTER — Ambulatory Visit: Payer: Medicaid Other | Attending: Maternal & Fetal Medicine

## 2021-12-08 ENCOUNTER — Other Ambulatory Visit: Payer: Self-pay | Admitting: *Deleted

## 2021-12-08 ENCOUNTER — Encounter: Payer: Self-pay | Admitting: *Deleted

## 2021-12-08 ENCOUNTER — Other Ambulatory Visit: Payer: Self-pay

## 2021-12-08 ENCOUNTER — Ambulatory Visit: Payer: Medicaid Other | Admitting: *Deleted

## 2021-12-08 VITALS — BP 145/75 | HR 79

## 2021-12-08 DIAGNOSIS — R87619 Unspecified abnormal cytological findings in specimens from cervix uteri: Secondary | ICD-10-CM | POA: Diagnosis present

## 2021-12-08 DIAGNOSIS — O34219 Maternal care for unspecified type scar from previous cesarean delivery: Secondary | ICD-10-CM

## 2021-12-08 DIAGNOSIS — O10013 Pre-existing essential hypertension complicating pregnancy, third trimester: Secondary | ICD-10-CM | POA: Diagnosis not present

## 2021-12-08 DIAGNOSIS — O10912 Unspecified pre-existing hypertension complicating pregnancy, second trimester: Secondary | ICD-10-CM | POA: Diagnosis present

## 2021-12-08 DIAGNOSIS — O10913 Unspecified pre-existing hypertension complicating pregnancy, third trimester: Secondary | ICD-10-CM

## 2021-12-08 DIAGNOSIS — O09293 Supervision of pregnancy with other poor reproductive or obstetric history, third trimester: Secondary | ICD-10-CM | POA: Diagnosis not present

## 2021-12-08 DIAGNOSIS — Z3A28 28 weeks gestation of pregnancy: Secondary | ICD-10-CM

## 2021-12-08 DIAGNOSIS — Z8759 Personal history of other complications of pregnancy, childbirth and the puerperium: Secondary | ICD-10-CM | POA: Diagnosis present

## 2021-12-08 DIAGNOSIS — O09299 Supervision of pregnancy with other poor reproductive or obstetric history, unspecified trimester: Secondary | ICD-10-CM | POA: Diagnosis present

## 2021-12-16 ENCOUNTER — Other Ambulatory Visit: Payer: Self-pay | Admitting: Obstetrics and Gynecology

## 2021-12-16 DIAGNOSIS — O10919 Unspecified pre-existing hypertension complicating pregnancy, unspecified trimester: Secondary | ICD-10-CM

## 2021-12-17 ENCOUNTER — Ambulatory Visit (INDEPENDENT_AMBULATORY_CARE_PROVIDER_SITE_OTHER): Payer: Medicaid Other | Admitting: Obstetrics and Gynecology

## 2021-12-17 ENCOUNTER — Other Ambulatory Visit: Payer: Self-pay

## 2021-12-17 ENCOUNTER — Encounter: Payer: Self-pay | Admitting: Obstetrics and Gynecology

## 2021-12-17 VITALS — BP 137/76 | HR 96 | Wt 192.0 lb

## 2021-12-17 DIAGNOSIS — O09899 Supervision of other high risk pregnancies, unspecified trimester: Secondary | ICD-10-CM

## 2021-12-17 DIAGNOSIS — O10919 Unspecified pre-existing hypertension complicating pregnancy, unspecified trimester: Secondary | ICD-10-CM

## 2021-12-17 DIAGNOSIS — Z98891 History of uterine scar from previous surgery: Secondary | ICD-10-CM

## 2021-12-17 DIAGNOSIS — R87619 Unspecified abnormal cytological findings in specimens from cervix uteri: Secondary | ICD-10-CM

## 2021-12-17 DIAGNOSIS — O09292 Supervision of pregnancy with other poor reproductive or obstetric history, second trimester: Secondary | ICD-10-CM

## 2021-12-17 NOTE — Progress Notes (Signed)
Subjective:  Taylor Chang is a 26 y.o. [id number] at [redacted]w[redacted]d being seen today for ongoing prenatal care.  She is currently monitored for the following issues for this high-risk pregnancy and has Supervision of other high risk pregnancy, antepartum; Chronic hypertension affecting pregnancy; Prior pregnancy with fetal demise and current pregnancy; H/O cesarean section; and Atypical glandular cells of undetermined significance (AGUS) on cervical Pap smear on their problem list.  Patient reports no complaints.  Contractions: Not present. Vag. Bleeding: None.  Movement: Present. Denies leaking of fluid.   The following portions of the patient's history were reviewed and updated as appropriate: allergies, current medications, past family history, past medical history, past social history, past surgical history and problem list. Problem list updated.  Objective:   Vitals:   12/17/21 1004  BP: 137/76  Pulse: 96  Weight: 192 lb (87.1 kg)    Fetal Status: Fetal Heart Rate (bpm): 150   Movement: Present     General:  Alert, oriented and cooperative. Patient is in no acute distress.  Skin: Skin is warm and dry. No rash noted.   Cardiovascular: Normal heart rate noted  Respiratory: Normal respiratory effort, no problems with respiration noted  Abdomen: Soft, gravid, appropriate for gestational age. Pain/Pressure: Absent     Pelvic:  Cervical exam deferred        Extremities: Normal range of motion.  Edema: None  Mental Status: Normal mood and affect. Normal behavior. Normal judgment and thought content.   Urinalysis:      Assessment and Plan:  Pregnancy: [id number] at [redacted]w[redacted]d  1. Supervision of other high risk pregnancy, antepartum Stable  2. Chronic hypertension affecting pregnancy BP stable on current regiment. Serial growth scans and antenatal testing as per MFM  3. Prior pregnancy with fetal demise and current pregnancy in second trimester See above  4. H/O cesarean section Desires ERLTCs  at 37 weeks due to # 3  5. Atypical glandular cells of undetermined significance (AGUS) on cervical Pap smear S/P colpo 11/22  Preterm labor symptoms and general obstetric precautions including but not limited to vaginal bleeding, contractions, leaking of fluid and fetal movement were reviewed in detail with the patient. Please refer to After Visit Summary for other counseling recommendations.  Return in about 2 weeks (around 12/31/2021) for OB visit, face to face, MD only.   Chancy Milroy, MD

## 2021-12-17 NOTE — Patient Instructions (Signed)

## 2021-12-30 ENCOUNTER — Encounter (HOSPITAL_BASED_OUTPATIENT_CLINIC_OR_DEPARTMENT_OTHER): Payer: Self-pay | Admitting: Emergency Medicine

## 2021-12-30 ENCOUNTER — Emergency Department (HOSPITAL_BASED_OUTPATIENT_CLINIC_OR_DEPARTMENT_OTHER)
Admission: EM | Admit: 2021-12-30 | Discharge: 2021-12-30 | Disposition: A | Discharge status: Home / Self Care | Payer: Medicaid Other | Attending: Emergency Medicine | Admitting: Emergency Medicine

## 2021-12-30 ENCOUNTER — Other Ambulatory Visit: Payer: Self-pay

## 2021-12-30 ENCOUNTER — Emergency Department (HOSPITAL_BASED_OUTPATIENT_CLINIC_OR_DEPARTMENT_OTHER): Payer: Medicaid Other

## 2021-12-30 DIAGNOSIS — Z3A30 30 weeks gestation of pregnancy: Secondary | ICD-10-CM | POA: Diagnosis not present

## 2021-12-30 DIAGNOSIS — N898 Other specified noninflammatory disorders of vagina: Secondary | ICD-10-CM

## 2021-12-30 DIAGNOSIS — Z7982 Long term (current) use of aspirin: Secondary | ICD-10-CM | POA: Insufficient documentation

## 2021-12-30 DIAGNOSIS — O23593 Infection of other part of genital tract in pregnancy, third trimester: Secondary | ICD-10-CM | POA: Insufficient documentation

## 2021-12-30 DIAGNOSIS — O10913 Unspecified pre-existing hypertension complicating pregnancy, third trimester: Secondary | ICD-10-CM | POA: Diagnosis not present

## 2021-12-30 DIAGNOSIS — Z3490 Encounter for supervision of normal pregnancy, unspecified, unspecified trimester: Secondary | ICD-10-CM

## 2021-12-30 DIAGNOSIS — A5901 Trichomonal vulvovaginitis: Secondary | ICD-10-CM | POA: Diagnosis not present

## 2021-12-30 LAB — WET PREP, GENITAL
Clue Cells Wet Prep HPF POC: NONE SEEN
Sperm: NONE SEEN
WBC, Wet Prep HPF POC: >=10 — AB (ref <10)
Yeast Wet Prep HPF POC: NONE SEEN

## 2021-12-30 MED ORDER — METRONIDAZOLE 500 MG PO TABS
500.0000 mg | ORAL_TABLET | Freq: Once | ORAL | Status: AC
  Administered 2021-12-30: 500 mg via ORAL
  Filled 2021-12-30: qty 1

## 2021-12-30 MED ORDER — METRONIDAZOLE 500 MG PO TABS: 500.0000 mg | ORAL_TABLET | Freq: Two times a day (BID) | ORAL | 0 refills | Status: DC

## 2021-12-30 NOTE — ED Provider Notes (Addendum)
[facility] Provider Note: Lowella Dell, MD, FACEP  CSN: [id number] MRN: [id number] ARRIVAL: 12/30/21 at 0037 ROOM: [facility]14/[facility]   CHIEF COMPLAINT  Vaginal Discharge   HISTORY OF PRESENT ILLNESS  12/30/21 12:50 AM Taylor Chang is a 26 y.o. female who is about [redacted] weeks pregnant.  Tonight when she used the restroom and wiped she noticed she had some pink vaginal discharge.  She is concerned because she had a placental abruption at 37 weeks with her previous pregnancy.  She is not currently having any pain or contractions.  She has been getting routine prenatal care.  She was placed on the toco monitor on arrival and MAU was contacted by nursing staff.   Past Medical History:  Diagnosis Date   Hypertension 2022    Past Surgical History:  Procedure Laterality Date   CESAREAN SECTION  11/2020   WISDOM TOOTH EXTRACTION  2019    Family History  Problem Relation Age of Onset   Hypertension Maternal Grandmother     Social History   Tobacco Use   Smoking status: Never   Smokeless tobacco: Never  Vaping Use   Vaping Use: Never used  Substance Use Topics   Alcohol use: No   Drug use: No    Prior to Admission medications   Medication Sig Start Date End Date Taking? Authorizing Provider  metroNIDAZOLE (FLAGYL) 500 MG tablet Take 1 tablet (500 mg total) by mouth 2 (two) times daily. One po bid x 7 days 12/30/21  Yes Solita Macadam, MD  aspirin EC 81 MG tablet Take 1 tablet (81 mg total) by mouth daily. Take after 12 weeks for prevention of preeclampsia later in pregnancy 08/21/21   Hermina Staggers, MD  Blood Pressure Monitoring (BLOOD PRESSURE KIT) DEVI 1 kit by Does not apply route once a week. 04/21/21   Conan Bowens, MD  Iron, Ferrous Sulfate, 325 (65 Fe) MG TABS Take 1 tablet by mouth every other day. 12/04/21   Worthy Rancher, MD  labetalol (NORMODYNE) 200 MG tablet TAKE 1 TABLET(200 MG) BY MOUTH TWICE DAILY 12/18/21   Adam Phenix, MD  Prenatal Vit-Fe  Fumarate-FA (PREPLUS) 27-1 MG TABS Take 1 tablet by mouth daily. 07/30/21   Hermina Staggers, MD    Allergies Patient has no known allergies.   REVIEW OF SYSTEMS  Negative except as noted here or in the History of Present Illness.   PHYSICAL EXAMINATION  Initial Vital Signs Blood pressure (!) 145/77, pulse 81, temperature 99 F (37.2 C), temperature source Oral, resp. rate 18, height 5\' 5"  (1.651 m), weight 87.1 kg, last menstrual period 05/26/2021, SpO2 100 %, unknown if currently breastfeeding.  Examination General: Well-developed, well-nourished female in no acute distress; appearance consistent with age of record HENT: normocephalic; atraumatic Eyes: Normal appearance Neck: supple Heart: regular rate and rhythm Lungs: clear to auscultation bilaterally Abdomen: soft; gravid, consistent with dates; nontender; bowel sounds present GU: Cervix closed, thick, high; no tenderness; mucus on examining glove clear without gross blood or blood tingeing Extremities: No deformity; full range of motion; pulses normal Neurologic: Awake, alert and oriented; motor function intact in all extremities and symmetric; no facial droop Skin: Warm and dry Psychiatric: Normal mood and affect   RESULTS  Summary of this visit's results, reviewed and interpreted by myself:   EKG Interpretation  Date/Time:    Ventricular Rate:    PR Interval:    QRS Duration:   QT Interval:    QTC Calculation:  R Axis:     Text Interpretation:         Laboratory Studies: Results for orders placed or performed during the hospital encounter of 12/30/21 (from the past 24 hour(s))  Wet prep, genital     Status: Abnormal   Collection Time: 12/30/21  1:10 AM   Specimen: Genital  Result Value Ref Range   Yeast Wet Prep HPF POC NONE SEEN NONE SEEN   Trich, Wet Prep PRESENT (A) NONE SEEN   Clue Cells Wet Prep HPF POC NONE SEEN NONE SEEN   WBC, Wet Prep HPF POC >=10 (A) <10   Sperm NONE SEEN    Imaging  Studies: US OB Limited  Result Date: 12/30/2021 CLINICAL DATA:  Vaginal discharge EXAM: LIMITED OBSTETRIC ULTRASOUND AND TRANSVAGINAL OBSTETRIC ULTRASOUND FINDINGS: Number of Fetuses: 1 Heart Rate:  144 bpm Movement: Yes Presentation: Cephalic Placental Location: Posterior Previa: No Amniotic Fluid (Subjective): Within normal limits. AFI: 13.1 cm BPD:  7.5cm 30w 0d MATERNAL FINDINGS: Cervix:  Appears closed. Uterus/Adnexae:  No abnormality visualized. IMPRESSION: Approximately 30 week intrauterine gestation. Fetal heart rate 144 beats per minute. Amniotic fluid within normal limits. No acute findings. This exam is performed on an emergent basis and does not comprehensively evaluate fetal size, dating, or anatomy; follow-up complete OB US should be considered if further fetal assessment is warranted. Electronically Signed   By: Rolm Baptise M.D.   On: 12/30/2021 01:59   US OB Transvaginal  Result Date: 12/30/2021 CLINICAL DATA:  Vaginal discharge EXAM: LIMITED OBSTETRIC ULTRASOUND AND TRANSVAGINAL OBSTETRIC ULTRASOUND FINDINGS: Number of Fetuses: 1 Heart Rate:  144 bpm Movement: Yes Presentation: Cephalic Placental Location: Posterior Previa: No Amniotic Fluid (Subjective): Within normal limits. AFI: 13.1 cm BPD:  7.5cm 30w 0d MATERNAL FINDINGS: Cervix:  Appears closed. Uterus/Adnexae:  No abnormality visualized. IMPRESSION: Approximately 30 week intrauterine gestation. Fetal heart rate 144 beats per minute. Amniotic fluid within normal limits. No acute findings. This exam is performed on an emergent basis and does not comprehensively evaluate fetal size, dating, or anatomy; follow-up complete OB US should be considered if further fetal assessment is warranted. Electronically Signed   By: Rolm Baptise M.D.   On: 12/30/2021 01:59    ED COURSE and MDM  Nursing notes, initial and subsequent vitals signs, including pulse oximetry, reviewed and interpreted by myself.  Vitals:   12/30/21 0045 12/30/21 0046   BP: (!) 145/77   Pulse: 81   Resp: 18   Temp: 99 F (37.2 C)   TempSrc: Oral   SpO2: 100%   Weight:  87.1 kg  Height:  $Remove'5\' 5"'oHKauRs$  (1.651 m)   Medications  metroNIDAZOLE (FLAGYL) tablet 500 mg (has no administration in time range)   The patient's ultrasound is reassuring.  No evidence of abruption.  The patient's wet prep shows trichomoniasis and we will treat her with Flagyl which is still considered the standard of care or even in pregnancy.  She has a follow-up appointment with her OB/GYN tomorrow.  Her toco monitor strip, which was monitored by MAO, was reassuring.   PROCEDURES  Procedures   ED DIAGNOSES     ICD-10-CM   1. Trichomoniasis of vagina  A59.01     2. Pregnant  Z34.90 US OB Limited    US OB Limited    3. Vaginal discharge  N89.8 US OB Transvaginal    US OB Transvaginal         Taylynn Easton, MD 12/30/21 0206    Shanon Rosser, MD 12/30/21  0210 ° °

## 2021-12-30 NOTE — ED Notes (Signed)
Rapid response nurse notified of pt

## 2021-12-30 NOTE — ED Notes (Signed)
Ultrasound complete  Pt placed back on monitor

## 2021-12-30 NOTE — ED Notes (Signed)
Ultrasound tech at bedside.  Patient updated on plan of care

## 2021-12-30 NOTE — ED Triage Notes (Signed)
Pt states tonight when she used the restroom and wiped she noticed she had some pink discharge  Pt is [redacted] weeks pregnant  High risk due to having a placenta aruption at 37 weeks with her last pregnancy

## 2021-12-30 NOTE — Progress Notes (Signed)
Call received from [facility] about G7P3 patient presenting at 31 wks with light pink vaginal discharge.  Pt denies LOF, denies bleeding, denies uc's. Endorses positive FM.  Pt reports hx of placental abruption with last pregnancy with a fetal demise at 37 wks. Reports two vaginal deliveries with 1 cesarean section.   EFM applied at 0104.  0116 bedside ultrasound performed at [facility].  Wet prep collected and resulted with positive trich.   0128 [facility] RN contacted to adjust EFM, informed that bedside ultrasound is still being performed. Will adjust EFM following completion of bedside ultrasound. 0150 ultrasound complete.  Dr. Debroah Loop contacted at 0154.  Order received for ED to treat trich result and patient is cleared OB following reactive fetal tracing.   0155 [facility] RN notified of plan of care.  0210 Category I tracing. No uc's noted via toco.  Pt cleared OB and disconnected from EFM at this time.   Jordan Hawks, RN RROB

## 2021-12-31 ENCOUNTER — Ambulatory Visit (INDEPENDENT_AMBULATORY_CARE_PROVIDER_SITE_OTHER): Payer: Medicaid Other | Admitting: Obstetrics and Gynecology

## 2021-12-31 ENCOUNTER — Encounter: Payer: Medicaid Other | Admitting: Obstetrics and Gynecology

## 2021-12-31 ENCOUNTER — Encounter: Payer: Self-pay | Admitting: Obstetrics and Gynecology

## 2021-12-31 VITALS — BP 136/83 | HR 81 | Wt 199.5 lb

## 2021-12-31 DIAGNOSIS — O26899 Other specified pregnancy related conditions, unspecified trimester: Secondary | ICD-10-CM

## 2021-12-31 DIAGNOSIS — O09899 Supervision of other high risk pregnancies, unspecified trimester: Secondary | ICD-10-CM

## 2021-12-31 DIAGNOSIS — Z6791 Unspecified blood type, Rh negative: Secondary | ICD-10-CM

## 2021-12-31 DIAGNOSIS — A599 Trichomoniasis, unspecified: Secondary | ICD-10-CM

## 2021-12-31 DIAGNOSIS — R87619 Unspecified abnormal cytological findings in specimens from cervix uteri: Secondary | ICD-10-CM

## 2021-12-31 DIAGNOSIS — O09292 Supervision of pregnancy with other poor reproductive or obstetric history, second trimester: Secondary | ICD-10-CM

## 2021-12-31 DIAGNOSIS — Z98891 History of uterine scar from previous surgery: Secondary | ICD-10-CM

## 2021-12-31 DIAGNOSIS — O10919 Unspecified pre-existing hypertension complicating pregnancy, unspecified trimester: Secondary | ICD-10-CM

## 2021-12-31 HISTORY — DX: Other specified pregnancy related conditions, unspecified trimester: O26.899

## 2021-12-31 HISTORY — DX: Unspecified blood type, rh negative: Z67.91

## 2021-12-31 LAB — GC/CHLAMYDIA PROBE AMP (~~LOC~~) NOT AT ARMC
Chlamydia: NEGATIVE
Comment: NEGATIVE
Comment: NORMAL
Neisseria Gonorrhea: NEGATIVE

## 2021-12-31 NOTE — Patient Instructions (Signed)

## 2021-12-31 NOTE — Progress Notes (Signed)
Pt in office for routine prenatal visit . She was seen at [facility] yesterday and prescribed Flagyl for Trichomoniasis. She is doing well with it so far and has no other concerns at this time.

## 2021-12-31 NOTE — Progress Notes (Signed)
Subjective:  Taylor Chang is a 26 y.o. [id number] at [redacted]w[redacted]d being seen today for ongoing prenatal care.  She is currently monitored for the following issues for this high-risk pregnancy and has Supervision of other high risk pregnancy, antepartum; Chronic hypertension affecting pregnancy; Prior pregnancy with fetal demise and current pregnancy; H/O cesarean section; Atypical glandular cells of undetermined significance (AGUS) on cervical Pap smear; Rh negative state in antepartum period; and Trichomoniasis on their problem list.  Patient reports  Dx with trich yesterday on Flagyl. .  Contractions: Not present. Vag. Bleeding: None.  Movement: Present. Denies leaking of fluid.   The following portions of the patient's history were reviewed and updated as appropriate: allergies, current medications, past family history, past medical history, past social history, past surgical history and problem list. Problem list updated.  Objective:   Vitals:   12/31/21 1023  BP: 136/83  Pulse: 81  Weight: 199 lb 8 oz (90.5 kg)    Fetal Status: Fetal Heart Rate (bpm): 141   Movement: Present     General:  Alert, oriented and cooperative. Patient is in no acute distress.  Skin: Skin is warm and dry. No rash noted.   Cardiovascular: Normal heart rate noted  Respiratory: Normal respiratory effort, no problems with respiration noted  Abdomen: Soft, gravid, appropriate for gestational age. Pain/Pressure: Absent     Pelvic:  Cervical exam deferred        Extremities: Normal range of motion.  Edema: None  Mental Status: Normal mood and affect. Normal behavior. Normal judgment and thought content.   Urinalysis:      Assessment and Plan:  Pregnancy: [id number] at [redacted]w[redacted]d  1. Atypical glandular cells of undetermined significance (AGUS) on cervical Pap smear S/P  colpo 11/22  2. Supervision of other high risk pregnancy, antepartum Stable  3. Chronic hypertension affecting pregnancy Stable Growth scans and  antenatal testing as per MFM  4. Prior pregnancy with fetal demise and current pregnancy in second trimester See above  5. H/O cesarean section Desires repeat at 37 weeks due to above  6. Rh negative state in antepartum period S/P Rhogam  7. Trichomoniasis Continue with Flagyl TOC next visit  Preterm labor symptoms and general obstetric precautions including but not limited to vaginal bleeding, contractions, leaking of fluid and fetal movement were reviewed in detail with the patient. Please refer to After Visit Summary for other counseling recommendations.  No follow-ups on file.   Chancy Milroy, MD

## 2022-01-02 IMAGING — US US MFM OB DETAIL+14 WK
1 series · 15 of 28 positions shown · non-contrast
Comparison: none

[Series 1: us mfm ob detail+14 wk · 136 acquisitions, 15 frames shown]
[im 1/136]
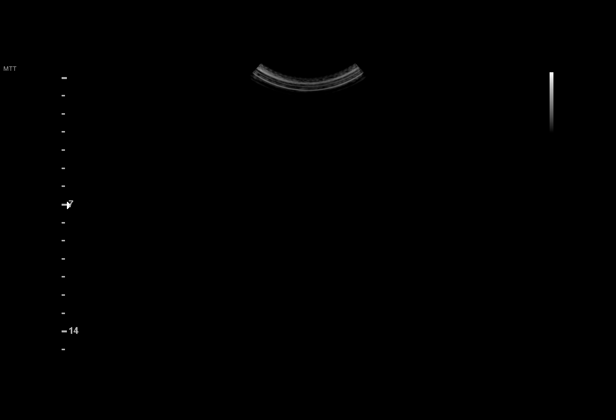
[im 11/136]
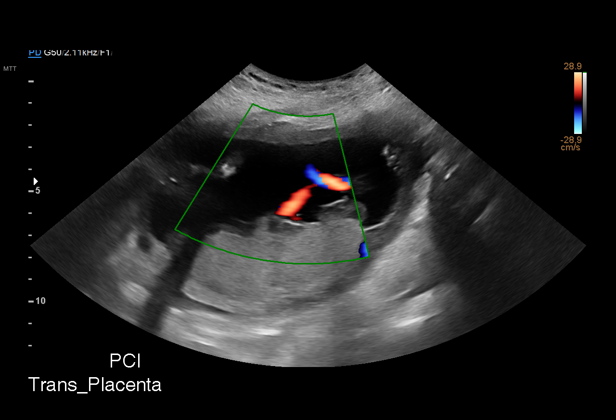
[im 21/136]
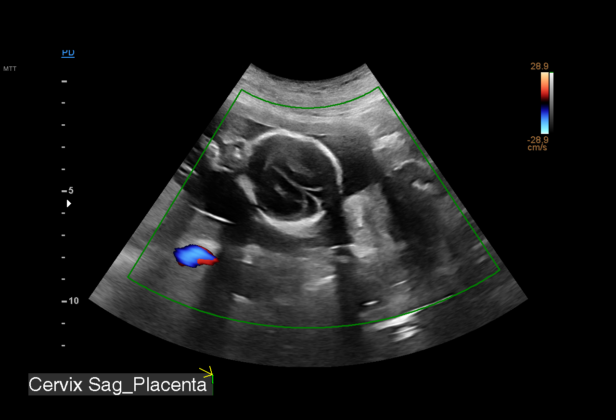
[im 31/136]
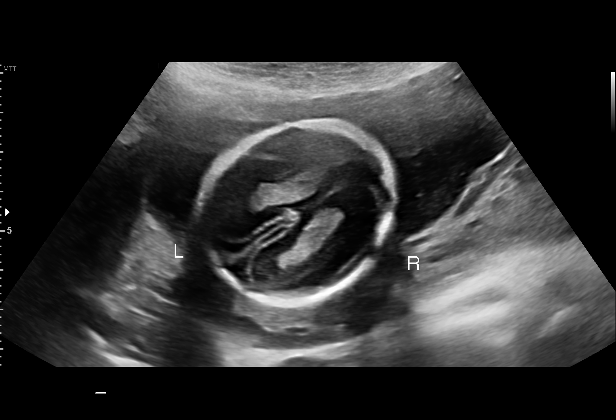
[im 41/136]
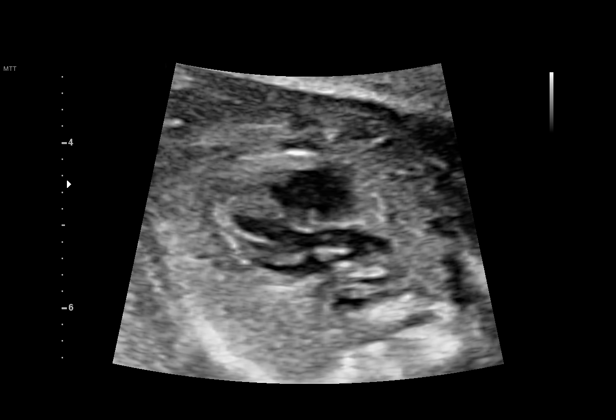
[im 51/136]
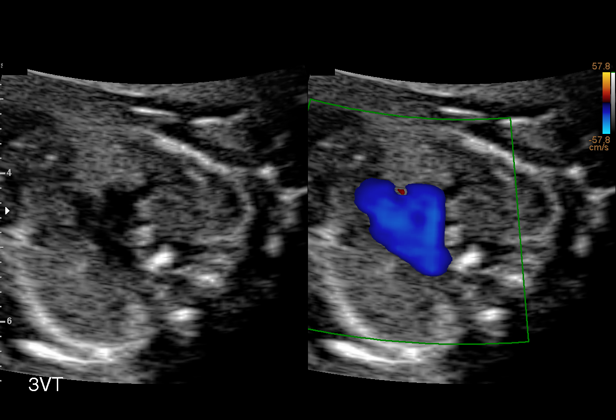
[im 61/136]
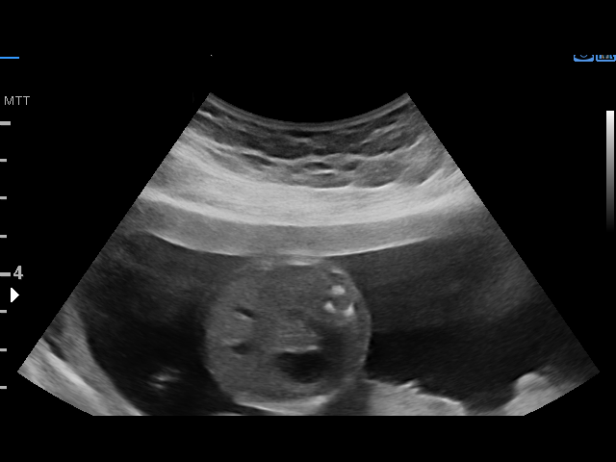
[im 71/136]
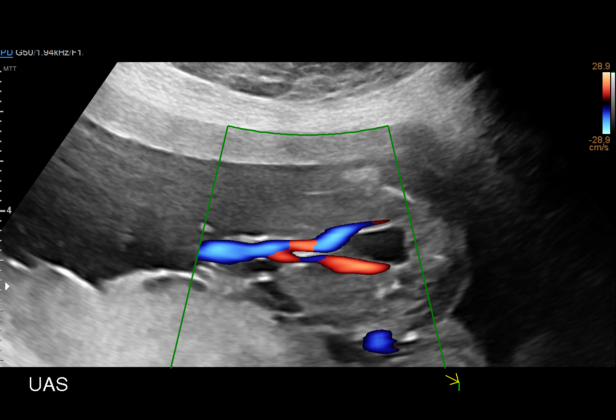
[im 76/136]
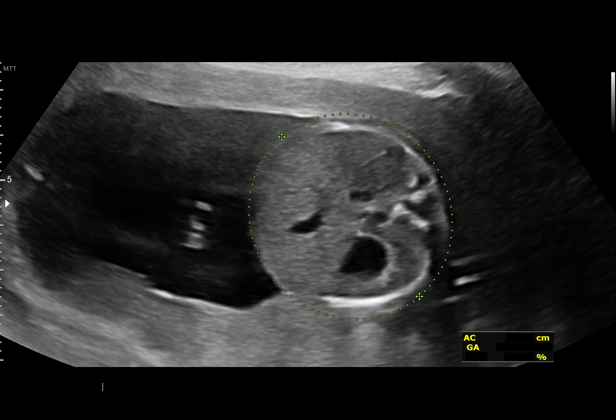
[im 86/136]
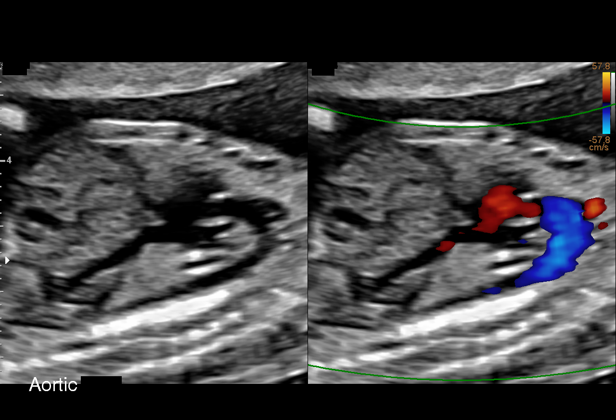
[im 96/136]
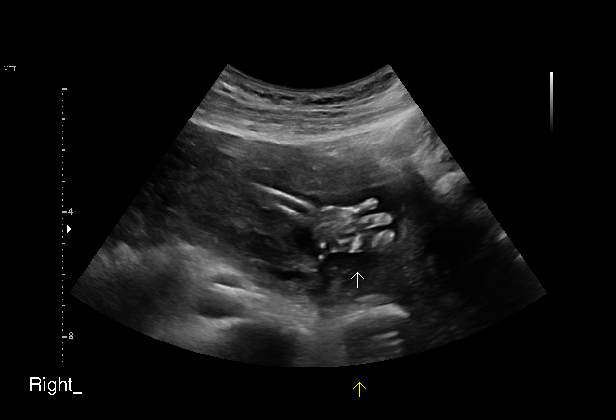
[im 106/136]
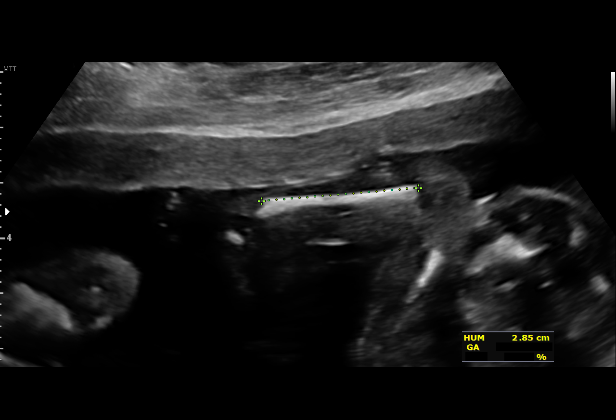
[im 116/136]
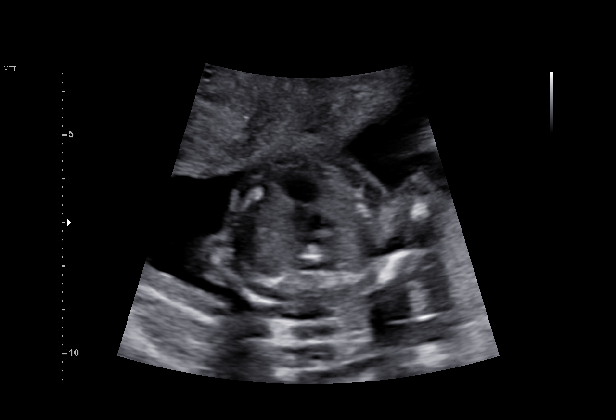
[im 126/136]
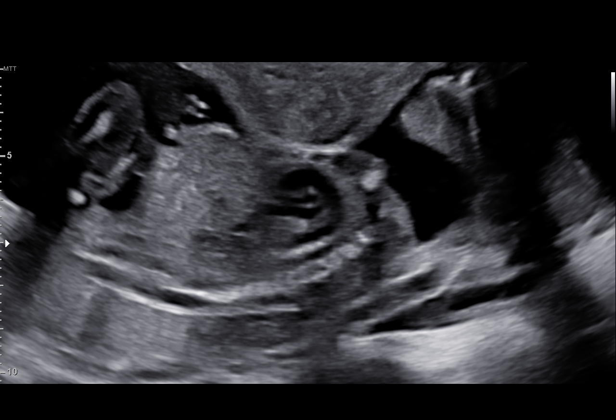
[im 136/136]
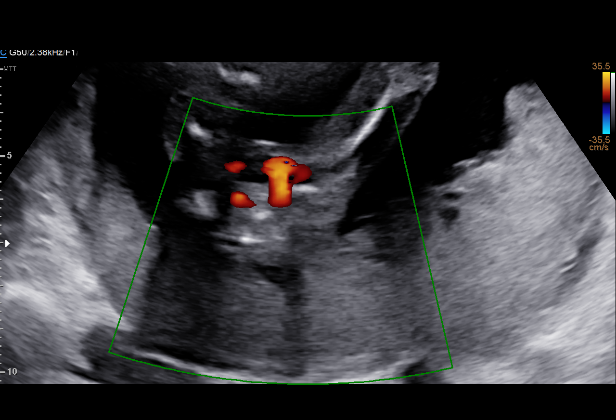

[15 of 28 positions shown; findings below may reference images not displayed]

[id number]

Indications

 Hypertension - Chronic/Pre-existing-
 Labetalool
 Poor obstetric history: Previous IUFD
 (stillbirth)
 19 weeks gestation of pregnancy
 Encounter for antenatal screening for
 malformations
 Poor obstetric history: Previous
 preeclampsia / eclampsia/gestational HTN
 Poor obstetric history: Previous fetal growth
 restriction (FGR)
 Previous cesarean delivery, antepartum
 Abnormal biochemical finding on antenatal
 screening of mother (+ Asralov
 Antibodies)
Fetal Evaluation

 Num Of Fetuses:         1
 Fetal Heart Rate(bpm):  153
 Cardiac Activity:       Observed
 Presentation:           Cephalic
 Placenta:               Posterior
 P. Cord Insertion:      Visualized, central
 Amniotic Fluid
 AFI FV:      Within normal limits

                             Largest Pocket(cm)

Biometry

 BPD:      44.1  mm     G. Age:  19w 2d         65  %    CI:        75.12   %    70 - 86
                                                         FL/HC:      17.9   %    16.1 -
 HC:      161.4  mm     G. Age:  19w 0d         39  %    HC/AC:      1.12        1.09 -
 AC:      143.8  mm     G. Age:  19w 5d         70  %    FL/BPD:     65.5   %
 FL:       28.9  mm     G. Age:  18w 6d         39  %    FL/AC:      20.1   %    20 - 24
 HUM:      28.5  mm     G. Age:  19w 1d         56  %
 CER:      19.6  mm     G. Age:  19w 0d         36  %
 NFT:       5.2  mm
 LV:        6.6  mm
 CM:        4.2  mm

 Est. FW:     284  gm    0 lb 10 oz      63  %
OB History

 Blood Type:   O-
 Gravidity:    6         Term:   3        Prem:   0        SAB:   1
 TOP:          1       Ectopic:  0        Living: 2
Gestational Age

 LMP:           19w 0d        Date:  05/26/21                 EDD:   03/02/22
 U/S Today:     19w 2d                                        EDD:   02/28/22
 Best:          19w 0d     Det. By:  LMP  (05/26/21)          EDD:   03/02/22
Anatomy

 Cranium:               Appears normal         LVOT:                   Appears normal
 Cavum:                 Appears normal         Aortic Arch:            Appears normal
 Ventricles:            Appears normal         Ductal Arch:            Appears normal
 Choroid Plexus:        Appears normal         Diaphragm:              Appears normal
 Cerebellum:            Appears normal         Stomach:                Appears normal, left
                                                                       sided
 Posterior Fossa:       Appears normal         Abdomen:                Appears normal
 Nuchal Fold:           Appears normal         Abdominal Wall:         Appears nml (cord
                                                                       insert, abd wall)
 Face:                  Appears normal         Cord Vessels:           Appears normal (3
                        (orbits and profile)                           vessel cord)
 Lips:                  Appears normal         Kidneys:                Appear normal
 Palate:                Not well visualized    Bladder:                Appears normal
 Thoracic:              Appears normal         Spine:                  Appears normal
 Heart:                 Appears normal; EIF    Upper Extremities:      Appears normal
 RVOT:                  Appears normal         Lower Extremities:      Appears normal

 Other:  Fetus appears to be female. Heels/feet and open hands/5th digits
         visualized. VC, 3VV and 3VTV visualized. Nasal bone previously
         seen. Technically difficult due to fetal position.
Cervix Uterus Adnexa
 Cervix
 Length:           3.55  cm.
 Normal appearance by transabdominal scan.

 Uterus
 No abnormality visualized.

 Right Ovary
 Within normal limits.

 Left Ovary
 Within normal limits.

 Cul De Sac
 No free fluid seen.

 Adnexa
 No abnormality visualized.
Comments

 Insoon Lena was seen for a detailed ultrasound exam
 due to chronic hypertension that is treated with labetalol 200
 mg twice a day.  She reports that she has been hypertensive
 since her pregnancy last year.  Her baseline PIH labs and
 P/C ratio were all within normal limits.
 The patient reports that her last pregnancy in November 2020
 where she delivered at [REDACTED], was
 complicated by a fetal demise most likely secondary to
 placental abruption at 37+ weeks.  She underwent an
 emergency C-section and was transfused multiple units of
 packed red blood cells due to significant hemorrhage.
 She has screened positive for Lara antibodies in her
 current pregnancy.  Her blood type is O-.
 She denies any problems in her current pregnancy.
 She had a cell free DNA test earlier in her pregnancy which
 indicated a low risk for trisomy 21, 18, and 13. A female fetus
 is predicted.
 She was informed that the fetal growth and amniotic fluid
 level were appropriate for her gestational age.
 On today's exam, an intracardiac echogenic focus was noted
 in the left ventricle of the fetal heart.  The small association
 between an echogenic focus and Down syndrome was
 discussed. Due to the echogenic focus noted today, the
 patient was offered and declined an amniocentesis today for
 definitive diagnosis of fetal aneuploidy.  She reports that she
 is comfortable with her negative cell free DNA test.
 The patient was informed that anomalies may be missed due
 to technical limitations. If the fetus is in a suboptimal position
 or maternal habitus is increased, visualization of the fetus in
 the maternal uterus may be impaired.
 The following were discussed today:
 Chronic hypertension in pregnancy
 The implications and management of chronic hypertension in
 pregnancy was discussed.  She was advised to continue
 taking labetalol for blood pressure control throughout her
 pregnancy.
 The patient was advised that should her blood pressures be
 elevated later in her pregnancy, the dosage of her
 antihypertensive medications may need to be increased.
 The increased risk of superimposed preeclampsia, an
 indicated preterm delivery, and possible fetal growth
 restriction due to chronic hypertension in pregnancy was
 discussed.
 We will continue to follow her with monthly growth scans.
 Weekly fetal testing should be started at around 32 weeks.
 To decrease her risk of superimposed preeclampsia, she
 should continue taking a daily baby aspirin (81 mg daily) for
 preeclampsia prophylaxis.
 Prior fetal demise at 37+ weeks possibly due to placental
 abruption
 The patient was advised that maintaining normal blood
 pressure control may help decrease her risk of placental
 abruption.
 Due to her prior demise and chronic hypertension, weekly
 fetal testing should be started at 32 weeks and continued
 until delivery.
 To decrease her risk of another adverse pregnancy outcome,
 delivery may be considered at between 37 to 38 weeks.
 Positive Lara antibodies
 Asralov antibodies are of the IgM class and will therefore
 not cross the placenta.  It should not cause fetal anemia.
 The patient was advised that she may have been exposed to
 the Jarvis antigen from her prior blood transfusion or from her
 prior pregnancies.
 She may have the father of the baby tested to determine if he
 carries the Jarvis antigen on his red blood cells if she wants.
 Whether or not he is screened will not make a difference in
 the management of her pregnancy.
 A follow-up exam was scheduled in 4 weeks.
 The patient stated that all of her questions have been
 answered.
 A total of 45 minutes was spent counseling and coordinating
 the care for this patient.  Greater than 50% of the time was
 spent in direct face-to-face contact.
Recommendations

 Continue labetalol for blood pressure control
 Continue daily baby aspirin for preeclampsia prophylaxis
 Monthly growth ultrasounds
 Start weekly fetal testing at 32 weeks
 Delivery at between 37 to 38 weeks

## 2022-01-05 ENCOUNTER — Ambulatory Visit: Payer: Medicaid Other | Attending: Obstetrics

## 2022-01-05 ENCOUNTER — Other Ambulatory Visit: Payer: Self-pay | Admitting: *Deleted

## 2022-01-05 ENCOUNTER — Other Ambulatory Visit: Payer: Self-pay

## 2022-01-05 ENCOUNTER — Ambulatory Visit: Payer: Medicaid Other | Admitting: *Deleted

## 2022-01-05 VITALS — BP 139/68 | HR 82

## 2022-01-05 DIAGNOSIS — O10013 Pre-existing essential hypertension complicating pregnancy, third trimester: Secondary | ICD-10-CM

## 2022-01-05 DIAGNOSIS — Z8759 Personal history of other complications of pregnancy, childbirth and the puerperium: Secondary | ICD-10-CM | POA: Diagnosis present

## 2022-01-05 DIAGNOSIS — O34219 Maternal care for unspecified type scar from previous cesarean delivery: Secondary | ICD-10-CM | POA: Diagnosis not present

## 2022-01-05 DIAGNOSIS — O10919 Unspecified pre-existing hypertension complicating pregnancy, unspecified trimester: Secondary | ICD-10-CM | POA: Insufficient documentation

## 2022-01-05 DIAGNOSIS — Z3A32 32 weeks gestation of pregnancy: Secondary | ICD-10-CM | POA: Diagnosis not present

## 2022-01-05 DIAGNOSIS — O10913 Unspecified pre-existing hypertension complicating pregnancy, third trimester: Secondary | ICD-10-CM | POA: Diagnosis not present

## 2022-01-05 DIAGNOSIS — O09293 Supervision of pregnancy with other poor reproductive or obstetric history, third trimester: Secondary | ICD-10-CM

## 2022-01-05 DIAGNOSIS — O09299 Supervision of pregnancy with other poor reproductive or obstetric history, unspecified trimester: Secondary | ICD-10-CM | POA: Diagnosis present

## 2022-01-12 ENCOUNTER — Other Ambulatory Visit: Payer: Self-pay

## 2022-01-12 ENCOUNTER — Ambulatory Visit: Payer: Medicaid Other | Admitting: *Deleted

## 2022-01-12 ENCOUNTER — Ambulatory Visit: Payer: Medicaid Other | Attending: Obstetrics

## 2022-01-12 ENCOUNTER — Encounter: Payer: Self-pay | Admitting: *Deleted

## 2022-01-12 VITALS — BP 137/67 | HR 82

## 2022-01-12 DIAGNOSIS — O34219 Maternal care for unspecified type scar from previous cesarean delivery: Secondary | ICD-10-CM | POA: Diagnosis not present

## 2022-01-12 DIAGNOSIS — Z8759 Personal history of other complications of pregnancy, childbirth and the puerperium: Secondary | ICD-10-CM | POA: Insufficient documentation

## 2022-01-12 DIAGNOSIS — Z3A33 33 weeks gestation of pregnancy: Secondary | ICD-10-CM | POA: Diagnosis not present

## 2022-01-12 DIAGNOSIS — O10013 Pre-existing essential hypertension complicating pregnancy, third trimester: Secondary | ICD-10-CM

## 2022-01-12 DIAGNOSIS — R87619 Unspecified abnormal cytological findings in specimens from cervix uteri: Secondary | ICD-10-CM | POA: Diagnosis present

## 2022-01-12 DIAGNOSIS — O10913 Unspecified pre-existing hypertension complicating pregnancy, third trimester: Secondary | ICD-10-CM | POA: Insufficient documentation

## 2022-01-12 DIAGNOSIS — O09299 Supervision of pregnancy with other poor reproductive or obstetric history, unspecified trimester: Secondary | ICD-10-CM | POA: Diagnosis present

## 2022-01-12 DIAGNOSIS — O09293 Supervision of pregnancy with other poor reproductive or obstetric history, third trimester: Secondary | ICD-10-CM | POA: Diagnosis not present

## 2022-01-14 ENCOUNTER — Other Ambulatory Visit (HOSPITAL_COMMUNITY)
Admission: RE | Admit: 2022-01-14 | Discharge: 2022-01-14 | Disposition: A | Discharge status: Home / Self Care | Payer: Medicaid Other | Source: Ambulatory Visit | Attending: Obstetrics and Gynecology | Admitting: Obstetrics and Gynecology

## 2022-01-14 ENCOUNTER — Encounter: Payer: Self-pay | Admitting: Obstetrics and Gynecology

## 2022-01-14 ENCOUNTER — Ambulatory Visit (INDEPENDENT_AMBULATORY_CARE_PROVIDER_SITE_OTHER): Payer: Medicaid Other | Admitting: Obstetrics and Gynecology

## 2022-01-14 ENCOUNTER — Other Ambulatory Visit: Payer: Self-pay

## 2022-01-14 VITALS — BP 126/78 | HR 80 | Wt 201.0 lb

## 2022-01-14 DIAGNOSIS — A599 Trichomoniasis, unspecified: Secondary | ICD-10-CM | POA: Insufficient documentation

## 2022-01-14 DIAGNOSIS — O09899 Supervision of other high risk pregnancies, unspecified trimester: Secondary | ICD-10-CM

## 2022-01-14 DIAGNOSIS — O26899 Other specified pregnancy related conditions, unspecified trimester: Secondary | ICD-10-CM

## 2022-01-14 DIAGNOSIS — R87619 Unspecified abnormal cytological findings in specimens from cervix uteri: Secondary | ICD-10-CM

## 2022-01-14 DIAGNOSIS — Z98891 History of uterine scar from previous surgery: Secondary | ICD-10-CM

## 2022-01-14 DIAGNOSIS — O10919 Unspecified pre-existing hypertension complicating pregnancy, unspecified trimester: Secondary | ICD-10-CM

## 2022-01-14 DIAGNOSIS — Z6791 Unspecified blood type, Rh negative: Secondary | ICD-10-CM

## 2022-01-14 DIAGNOSIS — O09293 Supervision of pregnancy with other poor reproductive or obstetric history, third trimester: Secondary | ICD-10-CM

## 2022-01-14 HISTORY — DX: Trichomoniasis, unspecified: A59.9

## 2022-01-14 NOTE — Progress Notes (Signed)
Subjective:  ?Taylor Chang is a 26 y.o. [phone] at [redacted]w[redacted]d being seen today for ongoing prenatal care.  She is currently monitored for the following issues for this high-risk pregnancy and has Supervision of other high risk pregnancy, antepartum; Chronic hypertension affecting pregnancy; Prior pregnancy with fetal demise and current pregnancy; H/O cesarean section; Atypical glandular cells of undetermined significance (AGUS) on cervical Pap smear; Rh negative state in antepartum period; and Trichomoniasis on their problem list. ? ?Patient reports general discomforts of pregnancy.  Contractions: Not present. Vag. Bleeding: None.  Movement: Present. Denies leaking of fluid.  ? ?The following portions of the patient's history were reviewed and updated as appropriate: allergies, current medications, past family history, past medical history, past social history, past surgical history and problem list. Problem list updated. ? ?Objective:  ? ?Vitals:  ? 01/14/22 1548  ?BP: 126/78  ?Pulse: 80  ?Weight: 201 lb (91.2 kg)  ? ? ?Fetal Status: Fetal Heart Rate (bpm): 140   Movement: Present    ? ?General:  Alert, oriented and cooperative. Patient is in no acute distress.  ?Skin: Skin is warm and dry. No rash noted.   ?Cardiovascular: Normal heart rate noted  ?Respiratory: Normal respiratory effort, no problems with respiration noted  ?Abdomen: Soft, gravid, appropriate for gestational age. Pain/Pressure: Absent     ?Pelvic:  Cervical exam deferred        ?Extremities: Normal range of motion.  Edema: Trace  ?Mental Status: Normal mood and affect. Normal behavior. Normal judgment and thought content.  ? ?Urinalysis:     ? ?Assessment and Plan:  ?Pregnancy: [id number] at [redacted]w[redacted]d ? ?1. Supervision of other high risk pregnancy, antepartum ?Stable ? ?2. Chronic hypertension affecting pregnancy ?BP stable ?Continue with serial growth scans and antenatal testing as per MFM ? ?3. Prior pregnancy with fetal demise and current pregnancy in  third trimester ?See above ?Repeat c section at 37 weeks ? ?4. H/O cesarean section ?See above ? ?5. Atypical glandular cells of undetermined significance (AGUS) on cervical Pap smear ?S/P colpo ? ?6. Rh negative state in antepartum period ?S/P Rhogam ? ?7. Trichomoniasis ?TOC today ?- Cervicovaginal ancillary only( [facility]) ? ?Preterm labor symptoms and general obstetric precautions including but not limited to vaginal bleeding, contractions, leaking of fluid and fetal movement were reviewed in detail with the patient. ?Please refer to After Visit Summary for other counseling recommendations.  ?Return in about 2 weeks (around 01/28/2022) for OB visit, face to face, MD only. ? ? ?Chancy Milroy, MD ?

## 2022-01-14 NOTE — Patient Instructions (Signed)

## 2022-01-14 NOTE — Progress Notes (Signed)
ROB 33.2 wks ?No unusual complaints ?

## 2022-01-16 LAB — CERVICOVAGINAL ANCILLARY ONLY
Chlamydia: NEGATIVE
Comment: NEGATIVE
Comment: NEGATIVE
Comment: NORMAL
Neisseria Gonorrhea: NEGATIVE
Trichomonas: POSITIVE — AB

## 2022-01-19 ENCOUNTER — Ambulatory Visit: Payer: Medicaid Other | Admitting: *Deleted

## 2022-01-19 ENCOUNTER — Other Ambulatory Visit: Payer: Self-pay | Admitting: Obstetrics and Gynecology

## 2022-01-19 ENCOUNTER — Ambulatory Visit (INDEPENDENT_AMBULATORY_CARE_PROVIDER_SITE_OTHER): Payer: Medicaid Other

## 2022-01-19 ENCOUNTER — Other Ambulatory Visit: Payer: Self-pay

## 2022-01-19 VITALS — BP 132/78 | HR 82

## 2022-01-19 DIAGNOSIS — O09293 Supervision of pregnancy with other poor reproductive or obstetric history, third trimester: Secondary | ICD-10-CM

## 2022-01-19 DIAGNOSIS — O10919 Unspecified pre-existing hypertension complicating pregnancy, unspecified trimester: Secondary | ICD-10-CM

## 2022-01-19 MED ORDER — METRONIDAZOLE 500 MG PO TABS: 500.0000 mg | ORAL_TABLET | Freq: Two times a day (BID) | ORAL | 0 refills | Status: DC

## 2022-01-19 NOTE — Progress Notes (Deleted)
a 

## 2022-01-19 NOTE — Progress Notes (Signed)

## 2022-01-26 ENCOUNTER — Encounter (HOSPITAL_COMMUNITY): Payer: Self-pay

## 2022-01-26 ENCOUNTER — Other Ambulatory Visit: Payer: Self-pay

## 2022-01-26 ENCOUNTER — Ambulatory Visit: Payer: Medicaid Other | Admitting: *Deleted

## 2022-01-26 ENCOUNTER — Ambulatory Visit (INDEPENDENT_AMBULATORY_CARE_PROVIDER_SITE_OTHER): Payer: Medicaid Other

## 2022-01-26 VITALS — BP 119/65 | HR 83

## 2022-01-26 DIAGNOSIS — O10919 Unspecified pre-existing hypertension complicating pregnancy, unspecified trimester: Secondary | ICD-10-CM

## 2022-01-26 DIAGNOSIS — O09293 Supervision of pregnancy with other poor reproductive or obstetric history, third trimester: Secondary | ICD-10-CM | POA: Diagnosis not present

## 2022-01-26 NOTE — Patient Instructions (Signed)
[name] ? 01/26/2022 ? ? Your procedure is scheduled on:  02/09/2022 ? Arrive at [employer] [facility] [employer] on [address] at [facility]  and [employer]. You are invited to use the FREE valet parking or use the Visitor's parking deck. ? Pick up the phone at the desk and dial [phone]. ? Call this number if you have problems the morning of surgery: [phone] ? Remember: ? ? Do not eat food:(After Midnight) Desp?s de medianoche. ? Do not drink clear liquids: (After Midnight) Desp?s de medianoche. ? Take these medicines the morning of surgery with A SIP OF WATER:  Take labetalol as prescribed ? ? Do not wear jewelry, make-up or nail polish. ? Do not wear lotions, powders, or perfumes. Do not wear deodorant. ? Do not shave 48 hours prior to surgery. ? Do not bring valuables to the hospital.  [facility] is not  ? responsible for any belongings or valuables brought to the hospital. ? Contacts, dentures or bridgework may not be worn into surgery. ? Leave suitcase in the car. After surgery it may be brought to your room. ? For patients admitted to the hospital, checkout time is 11:00 AM the day of  ?            discharge. ? ?   ? Please read over the following fact sheets that you were given:  ?   Preparing for Surgery ? ? ?

## 2022-01-28 ENCOUNTER — Other Ambulatory Visit: Payer: Self-pay

## 2022-01-28 ENCOUNTER — Ambulatory Visit (INDEPENDENT_AMBULATORY_CARE_PROVIDER_SITE_OTHER): Payer: Medicaid Other

## 2022-01-28 VITALS — BP 117/75 | HR 94 | Wt 203.0 lb

## 2022-01-28 DIAGNOSIS — O09293 Supervision of pregnancy with other poor reproductive or obstetric history, third trimester: Secondary | ICD-10-CM

## 2022-01-28 DIAGNOSIS — A599 Trichomoniasis, unspecified: Secondary | ICD-10-CM

## 2022-01-28 DIAGNOSIS — O09899 Supervision of other high risk pregnancies, unspecified trimester: Secondary | ICD-10-CM

## 2022-01-28 DIAGNOSIS — Z98891 History of uterine scar from previous surgery: Secondary | ICD-10-CM

## 2022-01-28 DIAGNOSIS — O10919 Unspecified pre-existing hypertension complicating pregnancy, unspecified trimester: Secondary | ICD-10-CM

## 2022-01-28 DIAGNOSIS — O26899 Other specified pregnancy related conditions, unspecified trimester: Secondary | ICD-10-CM

## 2022-01-28 NOTE — Progress Notes (Signed)
? ?  PRENATAL VISIT NOTE ? ?Subjective:  ?Taylor Chang is a 26 y.o. [phone] at [redacted]w[redacted]d being seen today for ongoing prenatal care.  She is currently monitored for the following issues for this high-risk pregnancy and has Supervision of other high risk pregnancy, antepartum; Chronic hypertension affecting pregnancy; Prior pregnancy with fetal demise and current pregnancy; H/O cesarean section; Atypical glandular cells of undetermined significance (AGUS) on cervical Pap smear; Rh negative state in antepartum period; and Trichomoniasis on their problem list. ? ?Patient reports no complaints.  Contractions: Not present. Vag. Bleeding: None.  Movement: Present. Denies leaking of fluid.  ? ?The following portions of the patient's history were reviewed and updated as appropriate: allergies, current medications, past family history, past medical history, past social history, past surgical history and problem list.  ? ?Objective:  ? ?Vitals:  ? 01/28/22 1046 01/28/22 1108  ?BP: (!) 144/76 117/75  ?Pulse: 84 94  ?Weight: 203 lb (92.1 kg) 203 lb (92.1 kg)  ? ? ?Fetal Status: Fetal Heart Rate (bpm): 144 Fundal Height: 36 cm Movement: Present    ? ?General:  Alert, oriented and cooperative. Patient is in no acute distress.  ?Skin: Skin is warm and dry. No rash noted.   ?Cardiovascular: Normal heart rate noted  ?Respiratory: Normal respiratory effort, no problems with respiration noted  ?Abdomen: Soft, gravid, appropriate for gestational age.  Pain/Pressure: Absent     ?Pelvic: Cervical exam deferred        ?Extremities: Normal range of motion.  Edema: None  ?Mental Status: Normal mood and affect. Normal behavior. Normal judgment and thought content.  ? ?Assessment and Plan:  ?Pregnancy: [id number] at [redacted]w[redacted]d ?1. Supervision of other high risk pregnancy, antepartum ?- Routine OB. Doing well. No concerns ?- Endorses active fetal movement ?- Anticipatory guidance for upcoming appointments provided ? ?2. Chronic hypertension affecting  pregnancy ?- Initial BP elevated, repeat normotensive ?- Patient running late and forgot to take BP meds this morning ?- Asymptomatic ?- Patient to take BP meds when she gets home ?- Reviewed warning signs/symptoms ? ?3. H/O cesarean section ?- Repeat scheduled at 37 weeks ? ?4. Prior pregnancy with fetal demise and current pregnancy in third trimester ?- See above ? ?5. Trichomoniasis ?- TOC next visit ? ?6. Rh negative state in antepartum period ?- S/p Rhogam at 28 weeks ? ? ?Preterm labor symptoms and general obstetric precautions including but not limited to vaginal bleeding, contractions, leaking of fluid and fetal movement were reviewed in detail with the patient. ?Please refer to After Visit Summary for other counseling recommendations.  ? ?Return in about 1 week (around 02/04/2022). ? ?Future Appointments  ?Date Time Provider [place]  ?02/02/2022 10:45 AM [facility] NURSE [facility]  ?02/02/2022 11:00 AM [facility] US1 [facility]  ?02/04/2022  3:30 PM Anyanwu, Sallyanne Havers, MD [facility] None  ?02/06/2022  9:30 AM [facility] PAT 1 [facility] None  ? ? ?Renee Harder, CNM ? ?

## 2022-02-02 ENCOUNTER — Encounter: Payer: Self-pay | Admitting: *Deleted

## 2022-02-02 ENCOUNTER — Other Ambulatory Visit: Payer: Self-pay

## 2022-02-02 ENCOUNTER — Ambulatory Visit: Payer: Medicaid Other | Attending: Obstetrics and Gynecology

## 2022-02-02 ENCOUNTER — Ambulatory Visit: Payer: Medicaid Other | Admitting: *Deleted

## 2022-02-02 VITALS — BP 137/75 | HR 79

## 2022-02-02 DIAGNOSIS — Z3A36 36 weeks gestation of pregnancy: Secondary | ICD-10-CM

## 2022-02-02 DIAGNOSIS — Z362 Encounter for other antenatal screening follow-up: Secondary | ICD-10-CM

## 2022-02-02 DIAGNOSIS — O09293 Supervision of pregnancy with other poor reproductive or obstetric history, third trimester: Secondary | ICD-10-CM | POA: Diagnosis not present

## 2022-02-02 DIAGNOSIS — O10919 Unspecified pre-existing hypertension complicating pregnancy, unspecified trimester: Secondary | ICD-10-CM | POA: Diagnosis present

## 2022-02-02 DIAGNOSIS — O10013 Pre-existing essential hypertension complicating pregnancy, third trimester: Secondary | ICD-10-CM

## 2022-02-02 DIAGNOSIS — O10913 Unspecified pre-existing hypertension complicating pregnancy, third trimester: Secondary | ICD-10-CM | POA: Insufficient documentation

## 2022-02-02 DIAGNOSIS — Z8759 Personal history of other complications of pregnancy, childbirth and the puerperium: Secondary | ICD-10-CM | POA: Insufficient documentation

## 2022-02-04 ENCOUNTER — Encounter (HOSPITAL_COMMUNITY): Payer: Self-pay | Admitting: Family Medicine

## 2022-02-04 ENCOUNTER — Other Ambulatory Visit: Payer: Self-pay

## 2022-02-04 ENCOUNTER — Encounter: Payer: Self-pay | Admitting: Obstetrics & Gynecology

## 2022-02-04 ENCOUNTER — Other Ambulatory Visit (HOSPITAL_COMMUNITY)
Admission: RE | Admit: 2022-02-04 | Discharge: 2022-02-04 | Disposition: A | Discharge status: Home / Self Care | Payer: Medicaid Other | Source: Ambulatory Visit | Attending: Obstetrics & Gynecology | Admitting: Obstetrics & Gynecology

## 2022-02-04 ENCOUNTER — Ambulatory Visit (INDEPENDENT_AMBULATORY_CARE_PROVIDER_SITE_OTHER): Payer: Medicaid Other | Admitting: Obstetrics & Gynecology

## 2022-02-04 VITALS — BP 119/71 | HR 86 | Wt 208.0 lb

## 2022-02-04 DIAGNOSIS — O09899 Supervision of other high risk pregnancies, unspecified trimester: Secondary | ICD-10-CM | POA: Diagnosis present

## 2022-02-04 DIAGNOSIS — O10919 Unspecified pre-existing hypertension complicating pregnancy, unspecified trimester: Secondary | ICD-10-CM

## 2022-02-04 DIAGNOSIS — O09293 Supervision of pregnancy with other poor reproductive or obstetric history, third trimester: Secondary | ICD-10-CM

## 2022-02-04 DIAGNOSIS — Z3A36 36 weeks gestation of pregnancy: Secondary | ICD-10-CM | POA: Diagnosis present

## 2022-02-04 DIAGNOSIS — Z98891 History of uterine scar from previous surgery: Secondary | ICD-10-CM

## 2022-02-04 NOTE — Progress Notes (Signed)
? ?PRENATAL VISIT NOTE ? ?Subjective:  ?Taylor Chang is a 26 y.o. [id number] at [redacted]w[redacted]d being seen today for ongoing prenatal care.  She is currently monitored for the following issues for this high-risk pregnancy and has Supervision of other high risk pregnancy, antepartum; Chronic hypertension affecting pregnancy; Prior pregnancy with fetal demise and current pregnancy; H/O cesarean section; Atypical glandular cells of undetermined significance (AGUS) on cervical Pap smear; Rh negative state in antepartum period; and Trichomoniasis on their problem list. ? ?Patient reports no complaints.  Contractions: Not present. Vag. Bleeding: None.  Movement: Present. Denies leaking of fluid.  ? ?The following portions of the patient's history were reviewed and updated as appropriate: allergies, current medications, past family history, past medical history, past social history, past surgical history and problem list.  ? ?Objective:  ? ?Vitals:  ? 02/04/22 1530  ?BP: 119/71  ?Pulse: 86  ?Weight: 208 lb (94.3 kg)  ? ? ?Fetal Status: Fetal Heart Rate (bpm): 140   Movement: Present    ? ?General:  Alert, oriented and cooperative. Patient is in no acute distress.  ?Skin: Skin is warm and dry. No rash noted.   ?Cardiovascular: Normal heart rate noted  ?Respiratory: Normal respiratory effort, no problems with respiration noted  ?Abdomen: Soft, gravid, appropriate for gestational age.  Pain/Pressure: Absent     ?Pelvic: Pelvic cultures done.  Exam performed in the presence of a chaperone        ?Extremities: Normal range of motion.     ?Mental Status: Normal mood and affect. Normal behavior. Normal judgment and thought content.  ? ?[place] MFM FETAL BPP WO NON STRESS ? ?Result Date: 02/02/2022 ?----------------------------------------------------------------------  OBSTETRICS REPORT                       (Signed Final 02/02/2022 01:06 pm) ---------------------------------------------------------------------- Patient Info  ID #:        [id number]                          D.O.B.:  05/24/1996 (25 yrs)  Name:       Taylor Chang                Visit Date: 02/02/2022 11:37 am ---------------------------------------------------------------------- Performed By  Attending:        Johnell Comings MD         Secondary Phy.:   Chancy Milroy                                                             MD  Performed By:     Georgie Chard        Address:          [address]  [place], [place]  Referred By:      [facility]             Location:         [facility]                                                             [facility]                                                             [facility]  Ref. Address:     [address]                    [place]                    [place] ---------------------------------------------------------------------- Orders  #  Description                           Code        Ordered By  1  [place] MFM OB FOLLOW UP                   [id number]    RAVI SHANKAR  2  [place] MFM FETAL BPP WO NON               [id number]    RAVI [facility]     STRESS ----------------------------------------------------------------------  #  Order #                     Accession #                Episode #  1  [id number]                   [id number]                 [id number]  2  [id number]                   [id number]  [id number] ---------------------------------------------------------------------- Indications  Hypertension - Chronic/Pre-existing            O10.019  Poor obstetric history: Previous IUFD          O09.299  (stillbirth)  Encounter for other antenatal screening        Z36.2  follow-up   [redacted] weeks gestation of pregnancy                Z3A.36 ---------------------------------------------------------------------- Fetal Evaluation  Num Of Fetuses:         1  Fetal Heart Rate(bpm):  136  Cardiac Activity:       Observed  Presentation:           Cephalic  Placenta:               Posterior  P. Cord Insertion:      Previously Visualized  Amniotic Fluid  AFI FV:      Within normal limits  AFI Sum(cm)     %Tile       Largest Pocket(cm)  9.95            22          2.97  RUQ(cm)       RLQ(cm)       LUQ(cm)        LLQ(cm)  2.46          2.64          1.88           2.97 ---------------------------------------------------------------------- Biophysical Evaluation  Amniotic F.V:   Within normal limits       F. Tone:        Observed  F. Movement:    Observed                   Score:          8/8  F. Breathing:   Observed ---------------------------------------------------------------------- Biometry  BPD:      82.6  mm     G. Age:  33w 2d        2.8  %    CI:        72.36   %    70 - 86                                                          FL/HC:      22.4   %    20.1 - 22.1  HC:      308.9  mm     G. Age:  34w 3d        2.8  %    HC/AC:      0.97        0.93 - 1.11  AC:       320   mm     G. Age:  35w 6d         58  %    FL/BPD:     83.7   %    71 - 87  FL:       69.1  mm     G. Age:  35w 3d         31  %    FL/AC:      21.6   %  20 - 24  Est. FW:    2656  gm    5 lb 14 oz      33  % ---------------------------------------------------------------------- OB History  Blood Type:   O-  Gravidity:    6         Term:   3        Prem:   0        SAB:   1  TOP:          1       Ectopic:  0        Living: 2 ---------------------------------------------------------------------- Gestational Age  LMP:           36w 0d        Date:  05/26/21                 EDD:   03/02/22  U/S Today:     34w 5d                                        EDD:   03/11/22  Best:          36w 0d     Det. By:  LMP  (05/26/21)          EDD:    03/02/22 ---------------------------------------------------------------------- Anatomy  Cranium:               Appears normal         LVOT:                   Previously seen  Cavum:                 Previously seen        Aortic Arch:            Previously seen  Ventricles:            Appears normal         Ductal Arch:            Previously seen  Choroid Plexus:        Previously seen        Diaphragm:              Appears normal  Cerebellum:            Previously seen        Stomach:                Appears normal, left                                                                        sided  Posterior Fossa:       Previously seen        Abdomen:                Previously seen  Nuchal Fold:           Not applicable ([id number]    Abdominal Wall:         Previously seen  wks GA)  Face:                  Orbits and profile     Cord Vessels:           Previously seen                         previously seen  Lips:                  Previously seen        Kidneys:                Appear normal  Palate:                Not well visualized    Bladder:                Appears normal  Thoracic:              Appears normal         Spine:                  Previously seen  Heart:                 Previously seen        Upper Extremities:      Previously seen  RVOT:                  Previously seen        Lower Extremities:      Previously seen  Other:  Fetus previously seen to be female. Heels/feet, open hands/5th digits,          VC, 3VV and 3VTV, Nasal bone previously seen. Technically difficult          due to fetal position. ---------------------------------------------------------------------- Comments  This patient was seen for a BPP and growth scan due to  chronic hypertension treated with labetalol and a prior fetal  demise at 37 weeks.  She denies any problems since her last  exam.  The fetal growth (EFW 5 pounds 14 ounces, 33rd percentile)  and amniotic fluid level appeared appropriate for her   gestational age today.  A biophysical profile performed today was 8 out of 8.  She already has a cesarean delivery scheduled on February 09, 2022 (next week).  No further exams were scheduled in our office.  The -----

## 2022-02-04 NOTE — Patient Instructions (Signed)
Return to office for any scheduled appointments. Call the office or go to the MAU at [facility] if:  You begin to have strong, frequent contractions  Your water breaks.  Sometimes it is a big gush of fluid, sometimes it is just a trickle that keeps getting your panties wet or running down your legs  You have vaginal bleeding.  It is normal to have a small amount of spotting if your cervix was checked.   You do not feel your baby moving like normal.  If you do not, get something to eat and drink and lay down and focus on feeling your baby move.   If your baby is still not moving like normal, you should call the office or go to MAU.  Any other obstetric concerns.   

## 2022-02-05 LAB — CERVICOVAGINAL ANCILLARY ONLY
Chlamydia: NEGATIVE
Comment: NEGATIVE
Comment: NORMAL
Neisseria Gonorrhea: NEGATIVE

## 2022-02-06 ENCOUNTER — Telehealth: Payer: Self-pay | Admitting: Obstetrics and Gynecology

## 2022-02-06 ENCOUNTER — Other Ambulatory Visit: Payer: Self-pay

## 2022-02-06 ENCOUNTER — Observation Stay (HOSPITAL_COMMUNITY)
Admission: AD | Admit: 2022-02-06 | Discharge: 2022-02-07 | Disposition: A | Discharge status: Home / Self Care | Payer: Medicaid Other | Attending: Obstetrics and Gynecology | Admitting: Obstetrics and Gynecology

## 2022-02-06 ENCOUNTER — Inpatient Hospital Stay (HOSPITAL_COMMUNITY)
Admission: RE | Admit: 2022-02-06 | Discharge: 2022-02-06 | Disposition: A | Discharge status: Home / Self Care | Payer: Medicaid Other | Source: Ambulatory Visit | Attending: Obstetrics and Gynecology | Admitting: Obstetrics and Gynecology

## 2022-02-06 DIAGNOSIS — O09293 Supervision of pregnancy with other poor reproductive or obstetric history, third trimester: Secondary | ICD-10-CM | POA: Insufficient documentation

## 2022-02-06 DIAGNOSIS — O10919 Unspecified pre-existing hypertension complicating pregnancy, unspecified trimester: Secondary | ICD-10-CM | POA: Diagnosis not present

## 2022-02-06 DIAGNOSIS — R87619 Unspecified abnormal cytological findings in specimens from cervix uteri: Secondary | ICD-10-CM | POA: Diagnosis not present

## 2022-02-06 DIAGNOSIS — Z3A36 36 weeks gestation of pregnancy: Secondary | ICD-10-CM | POA: Diagnosis not present

## 2022-02-06 DIAGNOSIS — O163 Unspecified maternal hypertension, third trimester: Secondary | ICD-10-CM | POA: Diagnosis not present

## 2022-02-06 DIAGNOSIS — Z3A37 37 weeks gestation of pregnancy: Secondary | ICD-10-CM | POA: Insufficient documentation

## 2022-02-06 DIAGNOSIS — Z98891 History of uterine scar from previous surgery: Secondary | ICD-10-CM | POA: Insufficient documentation

## 2022-02-06 DIAGNOSIS — Z79899 Other long term (current) drug therapy: Secondary | ICD-10-CM | POA: Diagnosis not present

## 2022-02-06 DIAGNOSIS — O26893 Other specified pregnancy related conditions, third trimester: Secondary | ICD-10-CM | POA: Diagnosis present

## 2022-02-06 DIAGNOSIS — Z7982 Long term (current) use of aspirin: Secondary | ICD-10-CM | POA: Diagnosis not present

## 2022-02-06 DIAGNOSIS — R7989 Other specified abnormal findings of blood chemistry: Secondary | ICD-10-CM | POA: Diagnosis not present

## 2022-02-06 HISTORY — DX: Encounter for other specified aftercare: Z51.89

## 2022-02-06 LAB — CBC
HCT: 30 % — ABNORMAL LOW (ref 36.0–46.0)
Hemoglobin: 9.9 g/dL — ABNORMAL LOW (ref 12.0–15.0)
MCH: 32.1 pg (ref 26.0–34.0)
MCHC: 33 g/dL (ref 30.0–36.0)
MCV: 97.4 fL (ref 80.0–100.0)
Platelets: 194 10*3/uL (ref 150–400)
RBC: 3.08 MIL/uL — ABNORMAL LOW (ref 3.87–5.11)
RDW: 13.1 % (ref 11.5–15.5)
WBC: 6.2 10*3/uL (ref 4.0–10.5)
nRBC: 0 % (ref 0.0–0.2)

## 2022-02-06 LAB — COMPREHENSIVE METABOLIC PANEL
ALT: 14 U/L (ref 0–44)
ALT: 15 U/L (ref 0–44)
AST: 20 U/L (ref 15–41)
AST: 19 U/L (ref 15–41)
Albumin: 2.7 g/dL — ABNORMAL LOW (ref 3.5–5.0)
Albumin: 3 g/dL — ABNORMAL LOW (ref 3.5–5.0)
Alkaline Phosphatase: 80 U/L (ref 38–126)
Alkaline Phosphatase: 80 U/L (ref 38–126)
Anion gap: 6 (ref 5–15)
Anion gap: 3 — ABNORMAL LOW (ref 5–15)
BUN: 11 mg/dL (ref 6–20)
BUN: 10 mg/dL (ref 6–20)
CO2: 21 mmol/L — ABNORMAL LOW (ref 22–32)
CO2: 23 mmol/L (ref 22–32)
Calcium: 8.7 mg/dL — ABNORMAL LOW (ref 8.9–10.3)
Calcium: 8.7 mg/dL — ABNORMAL LOW (ref 8.9–10.3)
Chloride: 110 mmol/L (ref 98–111)
Chloride: 108 mmol/L (ref 98–111)
Creatinine, Ser: 1.27 mg/dL — ABNORMAL HIGH (ref 0.44–1.00)
Creatinine, Ser: 1.2 mg/dL — ABNORMAL HIGH (ref 0.44–1.00)
GFR, Estimated: 60 mL/min — ABNORMAL LOW (ref >60)
GFR, Estimated: >60 mL/min (ref >60)
Glucose, Bld: 86 mg/dL (ref 70–99)
Glucose, Bld: 88 mg/dL (ref 70–99)
Potassium: 3.7 mmol/L (ref 3.5–5.1)
Potassium: 4 mmol/L (ref 3.5–5.1)
Sodium: 137 mmol/L (ref 135–145)
Sodium: 134 mmol/L — ABNORMAL LOW (ref 135–145)
Total Bilirubin: 0.2 mg/dL — ABNORMAL LOW (ref 0.3–1.2)
Total Bilirubin: 0.4 mg/dL (ref 0.3–1.2)
Total Protein: 6.7 g/dL (ref 6.5–8.1)
Total Protein: 6.7 g/dL (ref 6.5–8.1)

## 2022-02-06 LAB — RPR: RPR Ser Ql: NONREACTIVE

## 2022-02-06 NOTE — Telephone Encounter (Signed)
Cr 1.27 ?BP on 3/22 was normal ?Given h/o PreE and h/o abruption with loss, recommended she come in for repeat evaluation and blood pressure check. Her Cr baseline on review is normal. Pt reports she will come right in. MAU notified.  ?

## 2022-02-07 ENCOUNTER — Inpatient Hospital Stay (HOSPITAL_COMMUNITY): Payer: Medicaid Other

## 2022-02-07 ENCOUNTER — Encounter (HOSPITAL_COMMUNITY): Payer: Self-pay | Admitting: Obstetrics and Gynecology

## 2022-02-07 DIAGNOSIS — O10919 Unspecified pre-existing hypertension complicating pregnancy, unspecified trimester: Secondary | ICD-10-CM | POA: Diagnosis not present

## 2022-02-07 DIAGNOSIS — R87619 Unspecified abnormal cytological findings in specimens from cervix uteri: Secondary | ICD-10-CM

## 2022-02-07 DIAGNOSIS — R7989 Other specified abnormal findings of blood chemistry: Secondary | ICD-10-CM | POA: Diagnosis not present

## 2022-02-07 LAB — COMPREHENSIVE METABOLIC PANEL
ALT: 14 U/L (ref 0–44)
AST: 19 U/L (ref 15–41)
Albumin: 2.7 g/dL — ABNORMAL LOW (ref 3.5–5.0)
Alkaline Phosphatase: 79 U/L (ref 38–126)
Anion gap: 4 — ABNORMAL LOW (ref 5–15)
BUN: 8 mg/dL (ref 6–20)
CO2: 24 mmol/L (ref 22–32)
Calcium: 8.7 mg/dL — ABNORMAL LOW (ref 8.9–10.3)
Chloride: 106 mmol/L (ref 98–111)
Creatinine, Ser: 0.85 mg/dL (ref 0.44–1.00)
GFR, Estimated: >60 mL/min (ref >60)
Glucose, Bld: 78 mg/dL (ref 70–99)
Potassium: 3.7 mmol/L (ref 3.5–5.1)
Sodium: 134 mmol/L — ABNORMAL LOW (ref 135–145)
Total Bilirubin: 0.5 mg/dL (ref 0.3–1.2)
Total Protein: 6.3 g/dL — ABNORMAL LOW (ref 6.5–8.1)

## 2022-02-07 LAB — PROTEIN / CREATININE RATIO, URINE
Creatinine, Urine: 180.6 mg/dL
Protein Creatinine Ratio: 0.12 mg/mg{Cre} (ref 0.00–0.15)
Total Protein, Urine: 21 mg/dL

## 2022-02-07 LAB — CBC
HCT: 29.3 % — ABNORMAL LOW (ref 36.0–46.0)
Hemoglobin: 9.7 g/dL — ABNORMAL LOW (ref 12.0–15.0)
MCH: 32.3 pg (ref 26.0–34.0)
MCHC: 33.1 g/dL (ref 30.0–36.0)
MCV: 97.7 fL (ref 80.0–100.0)
Platelets: 181 10*3/uL (ref 150–400)
RBC: 3 MIL/uL — ABNORMAL LOW (ref 3.87–5.11)
RDW: 13.1 % (ref 11.5–15.5)
WBC: 8 10*3/uL (ref 4.0–10.5)
nRBC: 0 % (ref 0.0–0.2)

## 2022-02-07 MED ORDER — PRENATAL MULTIVITAMIN CH: 1.0000 | ORAL_TABLET | Freq: Every day | ORAL | Status: DC

## 2022-02-07 MED ORDER — DOCUSATE SODIUM 100 MG PO CAPS: 100.0000 mg | ORAL_CAPSULE | Freq: Every day | ORAL | Status: DC

## 2022-02-07 MED ORDER — LACTATED RINGERS IV BOLUS
1000.0000 mL | Freq: Once | INTRAVENOUS | Status: AC
Start: 2022-02-07 — End: 2022-02-07
  Administered 2022-02-07: 1000 mL via INTRAVENOUS

## 2022-02-07 MED ORDER — CALCIUM CARBONATE ANTACID 500 MG PO CHEW: 2.0000 | CHEWABLE_TABLET | ORAL | Status: DC | PRN

## 2022-02-07 MED ORDER — LACTATED RINGERS IV SOLN: INTRAVENOUS | Status: DC

## 2022-02-07 NOTE — H&P (Addendum)
FACULTY PRACTICE ANTEPARTUM ADMISSION HISTORY AND PHYSICAL NOTE   History of Present Illness: Taylor Chang is a 26 y.o. [id number] at [redacted]w[redacted]d admitted for observation in the setting of isolated elevated creatinine. She has no signs or symptoms of preeclampsia. She had the labwork done as routine preop labwork prior to her c-section on Monday which is a schedule repeat.  She is having a repeat c-section due to her history of her last delivery in January 2022 (son Demetrius who she lost).   For that delivery, she presented already severely hypertensive with severe HA. She was abrupting upon arrival and was already noted to have had an IUFD. She then continued to have worsening bleeding and they were concerned for DIC. She was taken for emergency c-section. She was in DIC and was admitted to the ICU and had multiple transfusions.   Besides her prior OB history, her pregnancy is complicated by [facility]. She is not on medications and has had mainly normal blood pressures with occasional 140s/90s. She is Rh negative and received Rhogam.   Patient reports the fetal movement as active. Patient reports uterine contraction  activity as none. Patient reports  vaginal bleeding as none. Patient describes fluid per vagina as None. Fetal presentation is cephalic on last [place] 3/22.   Patient Active Problem List   Diagnosis Date Noted   Elevated serum creatinine 02/07/2022   Rh negative state in antepartum period 12/31/2021   Trichomoniasis 12/31/2021   Atypical glandular cells of undetermined significance (AGUS) on cervical Pap smear 09/18/2021   Chronic hypertension affecting pregnancy 08/21/2021   Prior pregnancy with fetal demise and current pregnancy 08/21/2021   H/O cesarean section 08/21/2021   Supervision of other high risk pregnancy, antepartum 08/14/2021    Past Medical History:  Diagnosis Date   Blood transfusion without reported diagnosis    Hypertension 2022   Trichomoniasis 01/14/2022    Vaginal Pap smear, abnormal     Past Surgical History:  Procedure Laterality Date   CESAREAN SECTION  11/2020   WISDOM TOOTH EXTRACTION  2019    OB History  Gravida Para Term Preterm AB Living  7 3 3   3 2   SAB IAB Ectopic Multiple Live Births  1 1     2     # Outcome Date GA Lbr Len/2nd Weight Sex Delivery Anes PTL Lv  7 Current           6 SAB 04/2021          5 Term 12/08/20    Judie Petit CS-LTranv   FD     Complications: Placental abruption  4 Term 08/17/18    F Vag-Spont   LIV  3 AB 04/2016          2 Term 05/18/14    M Vag-Spont   LIV  1 IAB             Social History   Socioeconomic History   Marital status: Significant Other    Spouse name: Not on file   Number of children: Not on file   Years of education: Not on file   Highest education level: Not on file  Occupational History   Not on file  Tobacco Use   Smoking status: Never   Smokeless tobacco: Never  Vaping Use   Vaping Use: Never used  Substance and Sexual Activity   Alcohol use: No   Drug use: No   Sexual activity: Yes    Partners: Male    Birth control/protection:  None  Other Topics Concern   Not on file  Social History Narrative   Not on file   Social Determinants of Health   Financial Resource Strain: Not on file  Food Insecurity: Not on file  Transportation Needs: Not on file  Physical Activity: Not on file  Stress: Not on file  Social Connections: Not on file    Family History  Problem Relation Age of Onset   Hypertension Maternal Grandmother     No Known Allergies  Medications Prior to Admission  Medication Sig Dispense Refill Last Dose   aspirin EC 81 MG tablet Take 1 tablet (81 mg total) by mouth daily. Take after 12 weeks for prevention of preeclampsia later in pregnancy 300 tablet 2 02/07/2022   Iron, Ferrous Sulfate, 325 (65 Fe) MG TABS Take 1 tablet by mouth every other day. (Patient taking differently: Take 1 tablet by mouth every other day. At noon) 30 tablet 2 02/07/2022    labetalol (NORMODYNE) 200 MG tablet TAKE 1 TABLET(200 MG) BY MOUTH TWICE DAILY 60 tablet 3 02/07/2022   Prenatal Vit-Fe Fumarate-FA (PREPLUS) 27-1 MG TABS Take 1 tablet by mouth daily. (Patient taking differently: Take 1 tablet by mouth at bedtime.) 30 tablet 13 02/07/2022   Blood Pressure Monitoring (BLOOD PRESSURE KIT) DEVI 1 kit by Does not apply route once a week. 1 each 0    metroNIDAZOLE (FLAGYL) 500 MG tablet Take 1 tablet (500 mg total) by mouth 2 (two) times daily. 14 tablet 0     Review of Systems - Negative  Vitals:  BP 135/75   Pulse 72   Temp 98.3 F (36.8 C) (Oral)   Resp 20   Ht 5\' 5"  (1.651 m)   Wt 95.3 kg   LMP 05/26/2021   SpO2 100%   BMI 34.96 kg/m  Physical Examination: CONSTITUTIONAL: Well-developed, well-nourished female in no acute distress.  HENT:  Normocephalic, atraumatic, External right and left ear normal. Oropharynx is clear and moist EYES: Conjunctivae and EOM are normal. Pupils are equal, round, and reactive to light. No scleral icterus.  NECK: Normal range of motion, supple, no masses SKIN: Skin is warm and dry. No rash noted. Not diaphoretic. No erythema. No pallor. NEUROLOGIC: Alert and oriented to person, place, and time. Normal reflexes, muscle tone coordination. No cranial nerve deficit noted. PSYCHIATRIC: Normal mood and affect. Normal behavior. Normal judgment and thought content. CARDIOVASCULAR: Normal heart rate noted, regular rhythm RESPIRATORY: Effort and breath sounds normal, no problems with respiration noted ABDOMEN: Soft, nontender, nondistended, gravid. MUSCULOSKELETAL: Normal range of motion. No edema and no tenderness. 2+ distal pulses.  Fetal Monitoring:Baseline: 140 bpm, Variability: Good {> 6 bpm), Accelerations: Reactive, and Decelerations: Absent Tocometer: Intermittent contractions  Labs:  Results for orders placed or performed during the hospital encounter of 02/06/22 (from the past 24 hour(s))  Protein / creatinine ratio,  urine   Collection Time: 02/06/22 10:47 PM  Result Value Ref Range   Creatinine, Urine 180.60 mg/dL   Total Protein, Urine 21 mg/dL   Protein Creatinine Ratio 0.12 0.00 - 0.15 mg/mg[Cre]  Comprehensive metabolic panel   Collection Time: 02/06/22 11:07 PM  Result Value Ref Range   Sodium 134 (L) 135 - 145 mmol/L   Potassium 4.0 3.5 - 5.1 mmol/L   Chloride 108 98 - 111 mmol/L   CO2 23 22 - 32 mmol/L   Glucose, Bld 88 70 - 99 mg/dL   BUN 10 6 - 20 mg/dL   Creatinine, Ser 1.91 (H) 0.44 -  1.00 mg/dL   Calcium 8.7 (L) 8.9 - 10.3 mg/dL   Total Protein 6.7 6.5 - 8.1 g/dL   Albumin 3.0 (L) 3.5 - 5.0 g/dL   AST 19 15 - 41 U/L   ALT 15 0 - 44 U/L   Alkaline Phosphatase 80 38 - 126 U/L   Total Bilirubin 0.4 0.3 - 1.2 mg/dL   GFR, Estimated >40 >98 mL/min   Anion gap 3 (L) 5 - 15  Results for orders placed or performed during the hospital encounter of 02/06/22 (from the past 24 hour(s))  CBC   Collection Time: 02/06/22  9:42 AM  Result Value Ref Range   WBC 6.2 4.0 - 10.5 K/uL   RBC 3.08 (L) 3.87 - 5.11 MIL/uL   Hemoglobin 9.9 (L) 12.0 - 15.0 g/dL   HCT 11.9 (L) 14.7 - 82.9 %   MCV 97.4 80.0 - 100.0 fL   MCH 32.1 26.0 - 34.0 pg   MCHC 33.0 30.0 - 36.0 g/dL   RDW 56.2 13.0 - 86.5 %   Platelets 194 150 - 400 K/uL   nRBC 0.0 0.0 - 0.2 %  Comprehensive metabolic panel   Collection Time: 02/06/22  9:42 AM  Result Value Ref Range   Sodium 137 135 - 145 mmol/L   Potassium 3.7 3.5 - 5.1 mmol/L   Chloride 110 98 - 111 mmol/L   CO2 21 (L) 22 - 32 mmol/L   Glucose, Bld 86 70 - 99 mg/dL   BUN 11 6 - 20 mg/dL   Creatinine, Ser 7.84 (H) 0.44 - 1.00 mg/dL   Calcium 8.7 (L) 8.9 - 10.3 mg/dL   Total Protein 6.7 6.5 - 8.1 g/dL   Albumin 2.7 (L) 3.5 - 5.0 g/dL   AST 20 15 - 41 U/L   ALT 14 0 - 44 U/L   Alkaline Phosphatase 80 38 - 126 U/L   Total Bilirubin 0.2 (L) 0.3 - 1.2 mg/dL   GFR, Estimated 60 (L) >60 mL/min   Anion gap 6 5 - 15  RPR   Collection Time: 02/06/22  9:42 AM  Result  Value Ref Range   RPR Ser Ql NON REACTIVE NON REACTIVE  Type and screen [facility]   Collection Time: 02/06/22  9:42 AM  Result Value Ref Range   ABO/RH(D) O NEG    Antibody Screen POS    Sample Expiration 02/09/2022,2359    Antibody Identification      PASSIVELY ACQUIRED ANTI-D ANTI LEA Melvyn Neth a) Performed at [facility], [address], [place], [place] [address]    Unit Number [id number]    Blood Component Type RED CELLS,LR    Unit division 00    Status of Unit ALLOCATED    Transfusion Status PENDING    Crossmatch Result COMPATIBLE    Unit Number [id number]    Blood Component Type RED CELLS,LR    Unit division 00    Status of Unit ALLOCATED    Transfusion Status PENDING    Crossmatch Result COMPATIBLE   BPAM RBC   Collection Time: 02/06/22  9:42 AM  Result Value Ref Range   Blood Product Unit Number [id number]    PRODUCT CODE E0382V00    Unit Type and Rh 9500    Blood Product Expiration Date 295188416606    Blood Product Unit Number [id number]    PRODUCT CODE E0382V00    Unit Type and Rh 9500    Blood Product Expiration Date 732202542706  Imaging Studies: US RENAL  Result Date: 02/07/2022 CLINICAL DATA:  Elevated creatinine. EXAM: RENAL / URINARY TRACT ULTRASOUND COMPLETE COMPARISON:  None. FINDINGS: Right Kidney: Renal measurements: 11.3 cm x 6.6 cm x 4.6 cm = volume: 179.0 mL. Echogenicity within normal limits. There is mild hydronephrosis. No mass is visualized. Left Kidney: Renal measurements: 10.8 cm x 6.2 cm x 4.7 cm = volume: 166.0 mL. Echogenicity within normal limits. No mass or hydronephrosis visualized. Bladder: Appears normal for degree of bladder distention. Bilateral ureteral jets are seen. Other: None. IMPRESSION: Mild right-sided hydronephrosis. Electronically Signed   By: Aram Candela M.D.   On: 02/07/2022 01:53   [place] MFM FETAL BPP WO NON STRESS  Result Date:  02/02/2022 ----------------------------------------------------------------------  OBSTETRICS REPORT                       (Signed Final 02/02/2022 01:06 pm) ---------------------------------------------------------------------- Patient Info  ID #:       [id number]                          D.O.B.:  01/25/1996 (25 yrs)  Name:       Taylor Chang                Visit Date: 02/02/2022 11:37 am ---------------------------------------------------------------------- Performed By  Attending:        Ma Rings MD         Secondary Phy.:   Hermina Staggers                                                             MD  Performed By:     Sandi Mealy        Address:          [address]                                                             [place], [place]                                                             [address]  Referred By:      Reubin Milan             Location[facility]         [facility]                                                             [facility]                                                             [facility]  Ref. Address:     [address]                    [address]                    [place]                    [address] ---------------------------------------------------------------------- Orders  #  Description                           Code        Ordered By  1  [place] MFM OB FOLLOW UP                   [id number]    RAVI SHANKAR  2  [place] MFM FETAL BPP WO NON               76819.01    RAVI [facility]     STRESS ----------------------------------------------------------------------  #  Order #                     Accession #                Episode #  1  [id number]                   [id number]                 [id number]  2  [id number]                   [id number]                 [id number]  ---------------------------------------------------------------------- Indications  Hypertension - Chronic/Pre-existing            O10.019  Poor obstetric history: Previous IUFD          O09.299  (stillbirth)  Encounter for other antenatal screening        Z36.2  follow-up  [redacted] weeks gestation of pregnancy                Z3A.36 ---------------------------------------------------------------------- Fetal Evaluation  Num Of Fetuses:         1  Fetal Heart Rate(bpm):  136  Cardiac Activity:       Observed  Presentation:           Cephalic  Placenta:               Posterior  P. Cord Insertion:      Previously Visualized  Amniotic Fluid  AFI FV:      Within normal limits  AFI Sum(cm)     %Tile       Largest Pocket(cm)  9.95            22          2.97  RUQ(cm)       RLQ(cm)       LUQ(cm)        LLQ(cm)  2.46          2.64          1.88           2.97 ---------------------------------------------------------------------- Biophysical Evaluation  Amniotic F.V:   Within normal limits       F. Tone:        Observed  F. Movement:    Observed                   Score:          8/8  F. Breathing:   Observed ---------------------------------------------------------------------- Biometry  BPD:      82.6  mm     G. Age:  33w 2d        2.8  %    CI:        72.36   %    70 - 86                                                          FL/HC:      22.4   %    20.1 - 22.1  HC:      308.9  mm     G. Age:  34w 3d        2.8  %    HC/AC:      0.97        0.93 - 1.11  AC:       320   mm     G. Age:  35w 6d         58  %    FL/BPD:     83.7   %    71 - 87  FL:       69.1  mm     G. Age:  35w 3d         31  %    FL/AC:      21.6   %    20 - 24  Est. FW:    2656  gm    5 lb 14 oz      33  % ---------------------------------------------------------------------- OB History  Blood Type:   O-  Gravidity:    6         Term:   3        Prem:   0        SAB:   1  TOP:          1       Ectopic:  0        Living: 2  ---------------------------------------------------------------------- Gestational Age  LMP:           36w 0d        Date:  05/26/21  EDD:   03/02/22  U/S Today:     34w 5d                                        EDD:   03/11/22  Best:          36w 0d     Det. By:  LMP  (05/26/21)          EDD:   03/02/22 ---------------------------------------------------------------------- Anatomy  Cranium:               Appears normal         LVOT:                   Previously seen  Cavum:                 Previously seen        Aortic Arch:            Previously seen  Ventricles:            Appears normal         Ductal Arch:            Previously seen  Choroid Plexus:        Previously seen        Diaphragm:              Appears normal  Cerebellum:            Previously seen        Stomach:                Appears normal, left                                                                        sided  Posterior Fossa:       Previously seen        Abdomen:                Previously seen  Nuchal Fold:           Not applicable (>20    Abdominal Wall:         Previously seen                         wks GA)  Face:                  Orbits and profile     Cord Vessels:           Previously seen                         previously seen  Lips:                  Previously seen        Kidneys:                Appear normal  Palate:                Not well visualized    Bladder:  Appears normal  Thoracic:              Appears normal         Spine:                  Previously seen  Heart:                 Previously seen        Upper Extremities:      Previously seen  RVOT:                  Previously seen        Lower Extremities:      Previously seen  Other:  Fetus previously seen to be female. Heels/feet, open hands/5th digits,          VC, 3VV and 3VTV, Nasal bone previously seen. Technically difficult          due to fetal position. ---------------------------------------------------------------------- Comments   This patient was seen for a BPP and growth scan due to  chronic hypertension treated with labetalol and a prior fetal  demise at 37 weeks.  She denies any problems since her last  exam.  The fetal growth (EFW 5 pounds 14 ounces, 33rd percentile)  and amniotic fluid level appeared appropriate for her  gestational age today.  A biophysical profile performed today was 8 out of 8.  She already has a cesarean delivery scheduled on February 09, 2022 (next week).  No further exams were scheduled in our office.  The ----------------------------------------------------------------------                   Ma Rings, MD Electronically Signed Final Report   02/02/2022 01:06 pm ----------------------------------------------------------------------  [place] MFM FETAL BPP WO NON STRESS  Result Date: 01/12/2022 ----------------------------------------------------------------------  OBSTETRICS REPORT                       (Signed Final 01/12/2022 11:56 am) ---------------------------------------------------------------------- Patient Info  ID #:       [id number]                          D.O.B.:  07-03-1996 (25 yrs)  Name:       Taylor Chang                Visit Date: 01/12/2022 11:06 am ---------------------------------------------------------------------- Performed By  Attending:        Ma Rings MD         Secondary Phy.:   Hermina Staggers                                                             MD  Performed By:     Emeline Darling BS,      Address:          [address]                    [place]  [address]                                                             [place], [place]                                                             [address]  Referred By:      [facility]             [facility]         [facility]  Ref. Address:     [address]                    [address]                    [place]                    [address] ---------------------------------------------------------------------- Orders  #  Description                           Code        Ordered By  1  [place] MFM FETAL BPP WO NON               [id number]    YU FANG     STRESS ----------------------------------------------------------------------  #  Order #                     Accession #                Episode #  1  [id number]                   [id number]                 [id number] ---------------------------------------------------------------------- Indications  Hypertension - Chronic/Pre-existing-           O10.019  Labetalool  Poor obstetric history: Previous IUFD          O09.299  (stillbirth)  Abnormal biochemical finding on antenatal      O28.1  screening of mother (+ Anti-Lewis  Antibodies)  Poor obstetric history: Previous               O09.299  preeclampsia / eclampsia/gestational HTN  Poor obstetric history: Previous fetal growth  O09.299  restriction (FGR)  Previous cesarean delivery, antepartum         O34.219  Low Risk NIPS(Negative Horizon)(Negative  AFP)  [redacted] weeks gestation of pregnancy                Z3A.33 ---------------------------------------------------------------------- Fetal Evaluation  Num Of Fetuses:         1  Fetal Heart Rate(bpm):  135  Cardiac Activity:       Observed  Presentation:           Cephalic  Placenta:               Posterior  P. Cord Insertion:      Previously Visualized  Amniotic Fluid  AFI FV:      Within normal limits  AFI Sum(cm)     %Tile       Largest Pocket(cm)  17.7            65          7.  RUQ(cm)       RLQ(cm)       LUQ(cm)        LLQ(cm)  7             2.1           5              3.6 ---------------------------------------------------------------------- Biophysical Evaluation  Amniotic F.V:   Pocket => 2 cm              F. Tone:        Observed  F. Movement:    Observed                   Score:          8/8  F. Breathing:   Observed ---------------------------------------------------------------------- OB History  Blood Type:   O-  Gravidity:    6         Term:   3        Prem:   0        SAB:   1  TOP:          1       Ectopic:  0        Living: 2 ---------------------------------------------------------------------- Gestational Age  LMP:           33w 0d        Date:  05/26/21                 EDD:   03/02/22  Best:          33w 0d     Det. By:  LMP  (05/26/21)          EDD:   03/02/22 ---------------------------------------------------------------------- Anatomy  Stomach:               Appears normal, left   Bladder:  Appears normal                         sided ---------------------------------------------------------------------- Cervix Uterus Adnexa  Cervix  Not visualized (advanced GA >24wks) ---------------------------------------------------------------------- Comments  This patient was seen for a BPP due to a prior fetal demise at  37 weeks.  She denies any problems since her last exam.  A biophysical profile performed today was 8 out of 8.  There was normal amniotic fluid noted on today's ultrasound  exam.  The patient is already scheduled for weekly fetal testing in  your office for the next 2 weeks.  She will return on February 02, 2022 to our office for another  BPP and growth ultrasound.  She already has a cesarean delivery scheduled on February 09, 2022 (at 37 weeks). ----------------------------------------------------------------------                   Ma Rings, MD Electronically Signed Final Report   01/12/2022 11:56 am ----------------------------------------------------------------------  [place] MFM OB FOLLOW UP  Result Date: 02/02/2022 ----------------------------------------------------------------------  OBSTETRICS REPORT                       (Signed Final 02/02/2022 01:06 pm)  ---------------------------------------------------------------------- Patient Info  ID #:       [id number]                          D.O.B.:  04-20-1996 (25 yrs)  Name:       Taylor Chang                Visit Date: 02/02/2022 11:37 am ---------------------------------------------------------------------- Performed By  Attending:        Ma Rings MD         Secondary Phy.:   Hermina Staggers                                                             MD  Performed By:     Sandi Mealy        Address:          [address]                                                             [place], [place]  [address]  Referred By:      [facility]             Location[facility]         [facility]  Ref. Address:     [address]                    [address]                    [place]                    [address] ---------------------------------------------------------------------- Orders  #  Description                           Code        Ordered By  1  [place] MFM OB FOLLOW UP                   [id number]    RAVI SHANKAR  2  [place] MFM FETAL BPP WO NON               [id number]    RAVI [facility]     STRESS ----------------------------------------------------------------------  #  Order #                     Accession #                Episode #  1  [id number]                   [id number]                 [id number]  2  [id number]                   [id number]                 [id number] ---------------------------------------------------------------------- Indications  Hypertension - Chronic/Pre-existing            O10.019  Poor obstetric history: Previous IUFD           O09.299  (stillbirth)  Encounter for other antenatal screening        Z36.2  follow-up  [redacted] weeks gestation of pregnancy                Z3A.36 ---------------------------------------------------------------------- Fetal Evaluation  Num Of Fetuses:         1  Fetal Heart Rate(bpm):  136  Cardiac Activity:       Observed  Presentation:           Cephalic  Placenta:               Posterior  P. Cord Insertion:      Previously Visualized  Amniotic Fluid  AFI FV:      Within normal limits  AFI Sum(cm)     %Tile       Largest Pocket(cm)  9.95            22          2.97  RUQ(cm)       RLQ(cm)       LUQ(cm)        LLQ(cm)  2.46          2.64          1.88           2.97 ---------------------------------------------------------------------- Biophysical Evaluation  Amniotic F.V:   Within normal limits       F. Tone:        Observed  F. Movement:    Observed                   Score:          8/8  F. Breathing:   Observed ---------------------------------------------------------------------- Biometry  BPD:      82.6  mm     G. Age:  33w 2d        2.8  %    CI:        72.36   %    70 - 86                                                          FL/HC:      22.4   %    20.1 - 22.1  HC:      308.9  mm     G. Age:  34w 3d        2.8  %    HC/AC:      0.97        0.93 - 1.11  AC:       320   mm     G. Age:  35w 6d         58  %    FL/BPD:     83.7   %    71 - 87  FL:       69.1  mm     G. Age:  35w 3d         31  %    FL/AC:      21.6   %    20 - 24  Est. FW:    2656  gm    5 lb 14 oz      33  % ---------------------------------------------------------------------- OB History  Blood Type:   O-  Gravidity:    6         Term:   3  Prem:   0        SAB:   1  TOP:          1       Ectopic:  0        Living: 2 ---------------------------------------------------------------------- Gestational Age  LMP:           36w 0d        Date:  05/26/21                 EDD:   03/02/22  U/S Today:     34w 5d                                         EDD:   03/11/22  Best:          36w 0d     Det. By:  LMP  (05/26/21)          EDD:   03/02/22 ---------------------------------------------------------------------- Anatomy  Cranium:               Appears normal         LVOT:                   Previously seen  Cavum:                 Previously seen        Aortic Arch:            Previously seen  Ventricles:            Appears normal         Ductal Arch:            Previously seen  Choroid Plexus:        Previously seen        Diaphragm:              Appears normal  Cerebellum:            Previously seen        Stomach:                Appears normal, left                                                                        sided  Posterior Fossa:       Previously seen        Abdomen:                Previously seen  Nuchal Fold:           Not applicable (>20    Abdominal Wall:         Previously seen                         wks GA)  Face:                  Orbits and profile     Cord Vessels:           Previously seen  previously seen  Lips:                  Previously seen        Kidneys:                Appear normal  Palate:                Not well visualized    Bladder:                Appears normal  Thoracic:              Appears normal         Spine:                  Previously seen  Heart:                 Previously seen        Upper Extremities:      Previously seen  RVOT:                  Previously seen        Lower Extremities:      Previously seen  Other:  Fetus previously seen to be female. Heels/feet, open hands/5th digits,          VC, 3VV and 3VTV, Nasal bone previously seen. Technically difficult          due to fetal position. ---------------------------------------------------------------------- Comments  This patient was seen for a BPP and growth scan due to  chronic hypertension treated with labetalol and a prior fetal  demise at 37 weeks.  She denies any problems since her last  exam.  The fetal growth (EFW 5 pounds  14 ounces, 33rd percentile)  and amniotic fluid level appeared appropriate for her  gestational age today.  A biophysical profile performed today was 8 out of 8.  She already has a cesarean delivery scheduled on February 09, 2022 (next week).  No further exams were scheduled in our office.  The ----------------------------------------------------------------------                   Ma Rings, MD Electronically Signed Final Report   02/02/2022 01:06 pm ----------------------------------------------------------------------  US FETAL BPP W/NONSTRESS  Result Date: 02/04/2022 ----------------------------------------------------------------------  OBSTETRICS REPORT                       (Signed Final 02/04/2022 11:23 am) ---------------------------------------------------------------------- Patient Info  ID #:       [id number]                          D.O.B.:  05-23-1996 (25 yrs)  Name:       Taylor Chang                Visit Date: 01/19/2022 04:20 pm ---------------------------------------------------------------------- Performed By  Attending:        Scheryl Darter MD        Secondary Phy.:   Hermina Staggers                                                             MD  Performed By:     Sedalia Muta Day RNC  Address:          [address]                                                             [place], [place]                                                             [address]  Referred By:      [facility]             Location:         [facility]                                                             [facility]                                                             [facility] at                                                             [facility]  Ref. Address:     [address]                    [address]                    [place]                     [address] ---------------------------------------------------------------------- Orders  #  Description  Code        Ordered By  1  US FETAL BPP W/NONSTRESS              14782.9     Scheryl Darter ----------------------------------------------------------------------  #  Order #                     Accession #                Episode #  1  [id number]                   [id number]                 [id number] ---------------------------------------------------------------------- Service(s) Provided  US Fetal BPP W NST                                    [phone] ---------------------------------------------------------------------- Indications  [redacted] weeks gestation of pregnancy                Z3A.34  Hypertension - Chronic/Pre-existing            O10.019  Poor obstetric history: Previous IUFD          O49.299  (stillbirth) ---------------------------------------------------------------------- Fetal Evaluation  Num Of Fetuses:         1  Preg. Location:         Intrauterine  Cardiac Activity:       Observed  Presentation:           Cephalic  Amniotic Fluid  AFI FV:      Within normal limits  AFI Sum(cm)     %Tile       Largest Pocket(cm)  12.55           38          4.79  RUQ(cm)       RLQ(cm)       LUQ(cm)        LLQ(cm)  3.13          2.28          4.79           2.35 ---------------------------------------------------------------------- Biophysical Evaluation  Amniotic F.V:   Pocket => 2 cm             F. Tone:        Observed  F. Movement:    Observed                   N.S.T:          Reactive  F. Breathing:   Observed                   Score:          10/10 ---------------------------------------------------------------------- OB History  Blood Type:   O-  Gravidity:    6         Term:   3        Prem:   0        SAB:   1  TOP:          1       Ectopic:  0        Living: 2 ---------------------------------------------------------------------- Gestational Age  LMP:           34w 0d        Date:   05/26/21  EDD:   03/02/22  Best:          34w 0d     Det. By:  LMP  (05/26/21)          EDD:   03/02/22 ---------------------------------------------------------------------- Impression  NST is reactive. BPP 10/10. ---------------------------------------------------------------------- Recommendations  Continue weekly antenatal testing till delivery . ----------------------------------------------------------------------                 Scheryl Darter, MD Electronically Signed Final Report   02/04/2022 11:23 am ----------------------------------------------------------------------  US FETAL BPP W/NONSTRESS  Result Date: 02/02/2022 ----------------------------------------------------------------------  OBSTETRICS REPORT                       (Signed Final 02/02/2022 01:09 pm) ---------------------------------------------------------------------- Patient Info  ID #:       [id number]                          D.O.B.:  Feb 09, 1996 (25 yrs)  Name:       Taylor Chang                Visit Date: 01/26/2022 03:48 pm ---------------------------------------------------------------------- Performed By  Attending:        Scheryl Darter MD        Secondary Phy.:   Hermina Staggers                                                             MD  Performed By:     Marchelle Folks RNC          Address:          [address]                                                             [place], [place]                                                             [address]  Referred By:      Reubin Milan             [facility]         [facility]                                                             [facility] at                                                             [facility]                                                             Women  Ref. Address:     [address]                    [address]                    [place]                    [address] ---------------------------------------------------------------------- Orders  #  Description                           Code        Ordered By  1  US FETAL BPP W/NONSTRESS              60454.0     Scheryl Darter ----------------------------------------------------------------------  #  Order #                     Accession #                Episode #  1  [id number]                   [id number]                 [id number] ---------------------------------------------------------------------- Service(s) Provided  US Fetal BPP W NST                                    [phone] ---------------------------------------------------------------------- Indications  [redacted] weeks gestation of pregnancy                Z3A.35  Hypertension - Chronic/Pre-existing            O10.019  Poor obstetric history: Previous IUFD          O65.299  (stillbirth) ---------------------------------------------------------------------- Fetal Evaluation  Num Of Fetuses:         1  Preg. Location:         Intrauterine  Cardiac Activity:       Observed  Presentation:           Cephalic  Amniotic Fluid  AFI FV:      Within normal  limits  AFI Sum(cm)     %Tile       Largest Pocket(cm)  14.73           53          4.31  RUQ(cm)       RLQ(cm)       LUQ(cm)        LLQ(cm)  3.89          2.57          4.31           3.96 ---------------------------------------------------------------------- Biophysical Evaluation  Amniotic F.V:   Pocket => 2 cm             F. Tone:        Observed  F. Movement:    Observed                   N.S.T:          Reactive  F. Breathing:   Observed                   Score:          10/10 ---------------------------------------------------------------------- OB History  Blood Type:   O-  Gravidity:    6         Term:   3        Prem:   0        SAB:   1  TOP:          1       Ectopic:  0        Living: 2  ---------------------------------------------------------------------- Gestational Age  LMP:           35w 0d        Date:  05/26/21                  EDD:   03/02/22  Best:          Consuello Closs 0d     Det. By:  LMP  (05/26/21)          EDD:   03/02/22 ---------------------------------------------------------------------- Impression  35 weeks NST is reactive. BPP 10/10. ---------------------------------------------------------------------- Recommendations  Continue weekly antenatal testing till delivery . ----------------------------------------------------------------------                 Scheryl Darter, MD Electronically Signed Final Report   02/02/2022 01:09 pm ----------------------------------------------------------------------    Assessment and Plan: Patient Active Problem List   Diagnosis Date Noted   Elevated serum creatinine 02/07/2022   Rh negative state in antepartum period 12/31/2021   Trichomoniasis 12/31/2021   Atypical glandular cells of undetermined significance (AGUS) on cervical Pap smear 09/18/2021   Chronic hypertension affecting pregnancy 08/21/2021   Prior pregnancy with fetal demise and current pregnancy 08/21/2021   H/O cesarean section 08/21/2021   Supervision of other high risk pregnancy, antepartum 08/14/2021   Elevated serum creatinine - At this point, no clear etiology. If persistently elevated despite IVF hydration overnight, then she and I discussed the only remaining possibility in light of all other labs/imaging being normal, I would presume SIPE with SF by Cr >1.1. We discussed at this time her presentation is unusual due to everything else being normal for her but given her history, I would prefer to keep her in the hospital until we recheck the Creatinine again at 0700 which is about 8 hours from the last check. We reviewed if still elevated, I would recommend delivery but  if it returns to baseline, then she can go home until Monday when she has planned c-section at 37w. She  agrees with plan. She understands NPO due to possible surgery. I will recheck CBC in the event of delivery to have recent platelets in s/o possible SIPE with SF.   2. Fetal well being - NST reactive without decels    Milas Hock, MD, FACOG Attending Obstetrician & Gynecologist Faculty Practice, [facility]

## 2022-02-07 NOTE — MAU Note (Signed)
PT SAYS SHE HAD PRE-OP APPOINTMENT AT 930AM  DREW LABS  ?THEN THEN 2130- RECEIVED CALL TO COME HERE TO GET LABS REPEATED  ?DENIES H/A , BLURRED VISION  . NO EPIGASTRIC PAIN ?IS [facility] FOR C/S ON Monday  ? ?

## 2022-02-07 NOTE — Discharge Summary (Signed)
Patient ID: ?[name] ?MRN: [id number] ?DOB/AGE: 1996/09/25 26 y.o. ? ?Admit date: 02/06/2022 ?Discharge date: 02/07/2022 ? ?Admission Diagnoses: ? ?Discharge Diagnoses:  ? ?Prenatal Procedures: none ? ?Consults: Neonatology, Maternal Fetal Medicine ? ?Hospital Course:  ?Taylor Chang is a 26 y.o. [id number] at [redacted]w[redacted]d admitted for observation in the setting of isolated elevated creatinine. She has no signs or symptoms of preeclampsia. She had the labwork done as routine preop labwork prior to her c-section on Monday which is a schedule repeat.  She is having a repeat c-section due to her history of her last delivery in January 2022 (son Demetrius who she lost).  ?  ?For that delivery, she presented already severely hypertensive with severe HA. She was abrupting upon arrival and was already noted to have had an IUFD. She then continued to have worsening bleeding and they were concerned for DIC. She was taken for emergency c-section. She was in DIC and was admitted to the ICU and had multiple transfusions.  ?  ?Besides her prior OB history, her pregnancy is complicated by [facility]. She is not on medications and has had mainly normal blood pressures with occasional 140s/90s. She is Rh negative and received Rhogam.  ?  ?Patient reports the fetal movement as active. ?Patient reports uterine contraction  activity as none. ?Patient reports  vaginal bleeding as none. ?Patient describes fluid per vagina as None. ?Fetal presentation is cephalic on last [place] 3/22.  ?After hydration her creatinine normalized and she was discharged to return for scheduled cesarean section 3/27 ?Discharge Exam: ?Temp:  [97.7 ?F (36.5 ?C)-98.3 ?F (36.8 ?C)] 97.7 ?F (36.5 ?C) (03/25 2426) ?Pulse Rate:  [68-78] 71 (03/25 0723) ?Resp:  [16-20] 16 (03/25 0723) ?BP: (129-141)/(62-84) 133/80 (03/25 0723) ?SpO2:  [99 %-100 %] 100 % (03/25 0723) ?Weight:  [95.3 kg] 95.3 kg (03/25 0007) ?Physical Examination: ?CONSTITUTIONAL: Well-developed, well-nourished  female in no acute distress.  ?HENT:  Normocephalic, atraumatic, External right and left ear normal. Oropharynx is clear and moist ?EYES: Conjunctivae and EOM are normal. Pupils are equal, round, and reactive to light. No scleral icterus.  ?NECK: Normal range of motion, supple, no masses ?SKIN: Skin is warm and dry. No rash noted. Not diaphoretic. No erythema. No pallor. ?NEUROLGIC: Alert and oriented to person, place, and time. Normal reflexes, muscle tone coordination. No cranial nerve deficit noted. ?PSYCHIATRIC: Normal mood and affect. Normal behavior. Normal judgment and thought content. ?CARDIOVASCULAR: Normal heart rate noted, regular rhythm ?RESPIRATORY: Effort and breath sounds normal, no problems with respiration noted ?MUSCULOSKELETAL: Normal range of motion. No edema and no tenderness. 2+ distal pulses. ?ABDOMEN: Soft, nontender, nondistended, gravid. ?CERVIX:   ? ?Fetal monitoring:  ?Fetal Heart Rate A   ?Mode External filed at 02/07/2022 0800  ?Baseline Rate (A) 120 bpm filed at 02/07/2022 0800  ?Variability 6-25 BPM filed at 02/07/2022 0800  ?Accelerations 15 x 15 filed at 02/07/2022 0800  ?Decelerations None filed at 02/07/2022 0800  ? ?Uterine activity: 0 contractions per hour ? ?Significant Diagnostic Studies:  ?Results for orders placed or performed during the hospital encounter of 02/06/22 (from the past 168 hour(s))  ?Protein / creatinine ratio, urine  ? Collection Time: 02/06/22 10:47 PM  ?Result Value Ref Range  ? Creatinine, Urine 180.60 mg/dL  ? Total Protein, Urine 21 mg/dL  ? Protein Creatinine Ratio 0.12 0.00 - 0.15 mg/mg[Cre]  ?Comprehensive metabolic panel  ? Collection Time: 02/06/22 11:07 PM  ?Result Value Ref Range  ? Sodium 134 (L) 135 - 145 mmol/L  ?  Potassium 4.0 3.5 - 5.1 mmol/L  ? Chloride 108 98 - 111 mmol/L  ? CO2 23 22 - 32 mmol/L  ? Glucose, Bld 88 70 - 99 mg/dL  ? BUN 10 6 - 20 mg/dL  ? Creatinine, Ser 1.20 (H) 0.44 - 1.00 mg/dL  ? Calcium 8.7 (L) 8.9 - 10.3 mg/dL  ? Total  Protein 6.7 6.5 - 8.1 g/dL  ? Albumin 3.0 (L) 3.5 - 5.0 g/dL  ? AST 19 15 - 41 U/L  ? ALT 15 0 - 44 U/L  ? Alkaline Phosphatase 80 38 - 126 U/L  ? Total Bilirubin 0.4 0.3 - 1.2 mg/dL  ? GFR, Estimated >60 >60 mL/min  ? Anion gap 3 (L) 5 - 15  ?Comprehensive metabolic panel  ? Collection Time: 02/07/22  6:56 AM  ?Result Value Ref Range  ? Sodium 134 (L) 135 - 145 mmol/L  ? Potassium 3.7 3.5 - 5.1 mmol/L  ? Chloride 106 98 - 111 mmol/L  ? CO2 24 22 - 32 mmol/L  ? Glucose, Bld 78 70 - 99 mg/dL  ? BUN 8 6 - 20 mg/dL  ? Creatinine, Ser 0.85 0.44 - 1.00 mg/dL  ? Calcium 8.7 (L) 8.9 - 10.3 mg/dL  ? Total Protein 6.3 (L) 6.5 - 8.1 g/dL  ? Albumin 2.7 (L) 3.5 - 5.0 g/dL  ? AST 19 15 - 41 U/L  ? ALT 14 0 - 44 U/L  ? Alkaline Phosphatase 79 38 - 126 U/L  ? Total Bilirubin 0.5 0.3 - 1.2 mg/dL  ? GFR, Estimated >60 >60 mL/min  ? Anion gap 4 (L) 5 - 15  ?CBC  ? Collection Time: 02/07/22  6:56 AM  ?Result Value Ref Range  ? WBC 8.0 4.0 - 10.5 K/uL  ? RBC 3.00 (L) 3.87 - 5.11 MIL/uL  ? Hemoglobin 9.7 (L) 12.0 - 15.0 g/dL  ? HCT 29.3 (L) 36.0 - 46.0 %  ? MCV 97.7 80.0 - 100.0 fL  ? MCH 32.3 26.0 - 34.0 pg  ? MCHC 33.1 30.0 - 36.0 g/dL  ? RDW 13.1 11.5 - 15.5 %  ? Platelets 181 150 - 400 K/uL  ? nRBC 0.0 0.0 - 0.2 %  ?Results for orders placed or performed during the hospital encounter of 02/06/22 (from the past 168 hour(s))  ?CBC  ? Collection Time: 02/06/22  9:42 AM  ?Result Value Ref Range  ? WBC 6.2 4.0 - 10.5 K/uL  ? RBC 3.08 (L) 3.87 - 5.11 MIL/uL  ? Hemoglobin 9.9 (L) 12.0 - 15.0 g/dL  ? HCT 30.0 (L) 36.0 - 46.0 %  ? MCV 97.4 80.0 - 100.0 fL  ? MCH 32.1 26.0 - 34.0 pg  ? MCHC 33.0 30.0 - 36.0 g/dL  ? RDW 13.1 11.5 - 15.5 %  ? Platelets 194 150 - 400 K/uL  ? nRBC 0.0 0.0 - 0.2 %  ?Comprehensive metabolic panel  ? Collection Time: 02/06/22  9:42 AM  ?Result Value Ref Range  ? Sodium 137 135 - 145 mmol/L  ? Potassium 3.7 3.5 - 5.1 mmol/L  ? Chloride 110 98 - 111 mmol/L  ? CO2 21 (L) 22 - 32 mmol/L  ? Glucose, Bld 86 70 - 99 mg/dL   ? BUN 11 6 - 20 mg/dL  ? Creatinine, Ser 1.27 (H) 0.44 - 1.00 mg/dL  ? Calcium 8.7 (L) 8.9 - 10.3 mg/dL  ? Total Protein 6.7 6.5 - 8.1 g/dL  ? Albumin 2.7 (L) 3.5 - 5.0 g/dL  ?  AST 20 15 - 41 U/L  ? ALT 14 0 - 44 U/L  ? Alkaline Phosphatase 80 38 - 126 U/L  ? Total Bilirubin 0.2 (L) 0.3 - 1.2 mg/dL  ? GFR, Estimated 60 (L) >60 mL/min  ? Anion gap 6 5 - 15  ?RPR  ? Collection Time: 02/06/22  9:42 AM  ?Result Value Ref Range  ? RPR Ser Ql NON REACTIVE NON REACTIVE  ?Type and screen [facility]  ? Collection Time: 02/06/22  9:42 AM  ?Result Value Ref Range  ? ABO/RH(D) O NEG   ? Antibody Screen POS   ? Sample Expiration 02/09/2022,2359   ? Antibody Identification    ?  PASSIVELY ACQUIRED ANTI-D ANTI LEA Melvyn Neth a) ?Performed at [facility], [address], [place], [place] [address] ?  ? Unit Number [id number]   ? Blood Component Type RED CELLS,LR   ? Unit division 00   ? Status of Unit ALLOCATED   ? Transfusion Status PENDING   ? Crossmatch Result COMPATIBLE   ? Unit Number [id number]   ? Blood Component Type RED CELLS,LR   ? Unit division 00   ? Status of Unit ALLOCATED   ? Transfusion Status PENDING   ? Crossmatch Result COMPATIBLE   ?BPAM RBC  ? Collection Time: 02/06/22  9:42 AM  ?Result Value Ref Range  ? Blood Product Unit Number [id number]   ? PRODUCT CODE K8768T15   ? Unit Type and Rh 9500   ? Blood Product Expiration Date [id number]   ? Blood Product Unit Number [id number]   ? PRODUCT CODE Z2248G50   ? Unit Type and Rh 9500   ? Blood Product Expiration Date [id number]   ?Results for orders placed or performed in visit on 02/04/22 (from the past 168 hour(s))  ?Cervicovaginal ancillary only( [place])  ? Collection Time: 02/04/22  4:02 PM  ?Result Value Ref Range  ? Neisseria Gonorrhea Negative   ? Chlamydia Negative   ? Comment Normal Reference [place] Chlamydia - Negative   ? Comment    ?  Normal Reference Range Neisseria Gonorrhea - Negative  ? ? ?Discharge  Condition: Stable ? ?Disposition: Discharge disposition: 01-Home or Self Care ? ? ? ? ? ? ? ?Discharge Instructions   ? ? Discharge patient   Complete by: As directed ?  ? Discharge disposition: 01-Home or Self

## 2022-02-07 NOTE — Plan of Care (Signed)

## 2022-02-08 ENCOUNTER — Encounter: Payer: Self-pay | Admitting: Obstetrics & Gynecology

## 2022-02-08 DIAGNOSIS — O99820 Streptococcus B carrier state complicating pregnancy: Secondary | ICD-10-CM | POA: Insufficient documentation

## 2022-02-08 HISTORY — DX: Streptococcus B carrier state complicating pregnancy: O99.820

## 2022-02-08 LAB — CULTURE, BETA STREP (GROUP B ONLY): Strep Gp B Culture: POSITIVE — AB

## 2022-02-09 ENCOUNTER — Encounter (HOSPITAL_COMMUNITY): Payer: Self-pay | Admitting: Family Medicine

## 2022-02-09 ENCOUNTER — Inpatient Hospital Stay (HOSPITAL_COMMUNITY)
Admission: RE | Admit: 2022-02-09 | Discharge: 2022-02-11 | DRG: 787 | Disposition: A | Discharge status: Home / Self Care | Payer: Medicaid Other | Attending: Family Medicine | Admitting: Family Medicine

## 2022-02-09 ENCOUNTER — Other Ambulatory Visit: Payer: Self-pay

## 2022-02-09 ENCOUNTER — Inpatient Hospital Stay (HOSPITAL_COMMUNITY): Payer: Medicaid Other | Admitting: Anesthesiology

## 2022-02-09 ENCOUNTER — Other Ambulatory Visit: Payer: Medicaid Other

## 2022-02-09 ENCOUNTER — Encounter (HOSPITAL_COMMUNITY)
Admission: RE | Disposition: A | Discharge status: Home / Self Care | Payer: Self-pay | Source: Home / Self Care | Attending: Family Medicine

## 2022-02-09 DIAGNOSIS — O1092 Unspecified pre-existing hypertension complicating childbirth: Secondary | ICD-10-CM | POA: Diagnosis present

## 2022-02-09 DIAGNOSIS — O26893 Other specified pregnancy related conditions, third trimester: Secondary | ICD-10-CM | POA: Diagnosis present

## 2022-02-09 DIAGNOSIS — O34211 Maternal care for low transverse scar from previous cesarean delivery: Secondary | ICD-10-CM

## 2022-02-09 DIAGNOSIS — Z79899 Other long term (current) drug therapy: Secondary | ICD-10-CM

## 2022-02-09 DIAGNOSIS — R87619 Unspecified abnormal cytological findings in specimens from cervix uteri: Secondary | ICD-10-CM

## 2022-02-09 DIAGNOSIS — O9081 Anemia of the puerperium: Secondary | ICD-10-CM | POA: Diagnosis not present

## 2022-02-09 DIAGNOSIS — Z7982 Long term (current) use of aspirin: Secondary | ICD-10-CM | POA: Diagnosis not present

## 2022-02-09 DIAGNOSIS — Z3A37 37 weeks gestation of pregnancy: Secondary | ICD-10-CM

## 2022-02-09 DIAGNOSIS — O99820 Streptococcus B carrier state complicating pregnancy: Secondary | ICD-10-CM

## 2022-02-09 DIAGNOSIS — O26899 Other specified pregnancy related conditions, unspecified trimester: Secondary | ICD-10-CM

## 2022-02-09 DIAGNOSIS — O09293 Supervision of pregnancy with other poor reproductive or obstetric history, third trimester: Secondary | ICD-10-CM

## 2022-02-09 DIAGNOSIS — Z8249 Family history of ischemic heart disease and other diseases of the circulatory system: Secondary | ICD-10-CM

## 2022-02-09 DIAGNOSIS — O10919 Unspecified pre-existing hypertension complicating pregnancy, unspecified trimester: Secondary | ICD-10-CM | POA: Diagnosis present

## 2022-02-09 DIAGNOSIS — Z6791 Unspecified blood type, Rh negative: Secondary | ICD-10-CM | POA: Diagnosis not present

## 2022-02-09 DIAGNOSIS — D62 Acute posthemorrhagic anemia: Secondary | ICD-10-CM | POA: Diagnosis not present

## 2022-02-09 DIAGNOSIS — O99824 Streptococcus B carrier state complicating childbirth: Secondary | ICD-10-CM | POA: Diagnosis present

## 2022-02-09 DIAGNOSIS — Z98891 History of uterine scar from previous surgery: Secondary | ICD-10-CM

## 2022-02-09 DIAGNOSIS — Z8759 Personal history of other complications of pregnancy, childbirth and the puerperium: Secondary | ICD-10-CM

## 2022-02-09 HISTORY — DX: Unspecified abnormal cytological findings in specimens from vagina: R87.629

## 2022-02-09 LAB — ABO/RH: ABO/RH(D): O NEG

## 2022-02-09 SURGERY — Surgical Case
Anesthesia: Spinal

## 2022-02-09 MED ORDER — MEPERIDINE HCL 25 MG/ML IJ SOLN: 6.2500 mg | INTRAMUSCULAR | Status: DC | PRN

## 2022-02-09 MED ORDER — SCOPOLAMINE 1 MG/3DAYS TD PT72
MEDICATED_PATCH | TRANSDERMAL | Status: AC
  Filled 2022-02-09: qty 1

## 2022-02-09 MED ORDER — ONDANSETRON HCL 4 MG/2ML IJ SOLN
INTRAMUSCULAR | Status: AC
  Filled 2022-02-09: qty 2

## 2022-02-09 MED ORDER — MEASLES, MUMPS & RUBELLA VAC IJ SOLR: 0.5000 mL | Freq: Once | INTRAMUSCULAR | Status: DC

## 2022-02-09 MED ORDER — CEFAZOLIN SODIUM-DEXTROSE 2-3 GM-%(50ML) IV SOLR
INTRAVENOUS | Status: DC | PRN
Start: 2022-02-09 — End: 2022-02-09
  Administered 2022-02-09: 2 g via INTRAVENOUS

## 2022-02-09 MED ORDER — DIPHENHYDRAMINE HCL 25 MG PO CAPS: 25.0000 mg | ORAL_CAPSULE | ORAL | Status: DC | PRN

## 2022-02-09 MED ORDER — COCONUT OIL OIL: 1.0000 "application " | TOPICAL_OIL | Status: DC | PRN

## 2022-02-09 MED ORDER — METOCLOPRAMIDE HCL 5 MG/ML IJ SOLN
INTRAMUSCULAR | Status: AC
  Filled 2022-02-09: qty 2

## 2022-02-09 MED ORDER — MORPHINE SULFATE (PF) 0.5 MG/ML IJ SOLN
INTRAMUSCULAR | Status: AC
  Filled 2022-02-09: qty 10

## 2022-02-09 MED ORDER — SODIUM CHLORIDE 0.9% FLUSH: 3.0000 mL | INTRAVENOUS | Status: DC | PRN

## 2022-02-09 MED ORDER — MENTHOL 3 MG MT LOZG: 1.0000 | LOZENGE | OROMUCOSAL | Status: DC | PRN

## 2022-02-09 MED ORDER — KETOROLAC TROMETHAMINE 30 MG/ML IJ SOLN
INTRAMUSCULAR | Status: AC
  Filled 2022-02-09: qty 1

## 2022-02-09 MED ORDER — ACETAMINOPHEN 500 MG PO TABS: 1000.0000 mg | ORAL_TABLET | Freq: Once | ORAL | Status: DC | PRN

## 2022-02-09 MED ORDER — SOD CITRATE-CITRIC ACID 500-334 MG/5ML PO SOLN
ORAL | Status: AC
  Filled 2022-02-09: qty 30

## 2022-02-09 MED ORDER — MORPHINE SULFATE (PF) 0.5 MG/ML IJ SOLN
INTRAMUSCULAR | Status: DC | PRN
  Administered 2022-02-09: 150 ug via INTRATHECAL

## 2022-02-09 MED ORDER — ACETAMINOPHEN 10 MG/ML IV SOLN
INTRAVENOUS | Status: DC | PRN
  Administered 2022-02-09: 1000 mg via INTRAVENOUS

## 2022-02-09 MED ORDER — OXYCODONE HCL 5 MG PO TABS
5.0000 mg | ORAL_TABLET | ORAL | Status: DC | PRN
  Administered 2022-02-10 – 2022-02-11 (×2): 5 mg via ORAL
  Filled 2022-02-09 (×2): qty 1

## 2022-02-09 MED ORDER — SIMETHICONE 80 MG PO CHEW: 80.0000 mg | CHEWABLE_TABLET | ORAL | Status: DC | PRN

## 2022-02-09 MED ORDER — DIPHENHYDRAMINE HCL 25 MG PO CAPS: 25.0000 mg | ORAL_CAPSULE | Freq: Four times a day (QID) | ORAL | Status: DC | PRN

## 2022-02-09 MED ORDER — ACETAMINOPHEN 10 MG/ML IV SOLN: 1000.0000 mg | Freq: Once | INTRAVENOUS | Status: DC | PRN

## 2022-02-09 MED ORDER — ACETAMINOPHEN 160 MG/5ML PO SOLN: 1000.0000 mg | Freq: Once | ORAL | Status: DC | PRN

## 2022-02-09 MED ORDER — CEFAZOLIN SODIUM-DEXTROSE 2-4 GM/100ML-% IV SOLN
INTRAVENOUS | Status: AC
  Filled 2022-02-09: qty 100

## 2022-02-09 MED ORDER — CEFAZOLIN SODIUM-DEXTROSE 2-4 GM/100ML-% IV SOLN: 2.0000 g | INTRAVENOUS | Status: DC

## 2022-02-09 MED ORDER — DEXAMETHASONE SODIUM PHOSPHATE 4 MG/ML IJ SOLN
INTRAMUSCULAR | Status: AC
  Filled 2022-02-09: qty 2

## 2022-02-09 MED ORDER — NALOXONE HCL 4 MG/10ML IJ SOLN
1.0000 ug/kg/h | INTRAVENOUS | Status: DC | PRN
  Filled 2022-02-09: qty 5

## 2022-02-09 MED ORDER — LACTATED RINGERS IV SOLN: INTRAVENOUS | Status: DC

## 2022-02-09 MED ORDER — MEDROXYPROGESTERONE ACETATE 150 MG/ML IM SUSP: 150.0000 mg | INTRAMUSCULAR | Status: DC | PRN

## 2022-02-09 MED ORDER — TETANUS-DIPHTH-ACELL PERTUSSIS 5-2.5-18.5 LF-MCG/0.5 IM SUSY: 0.5000 mL | PREFILLED_SYRINGE | Freq: Once | INTRAMUSCULAR | Status: DC

## 2022-02-09 MED ORDER — SENNOSIDES-DOCUSATE SODIUM 8.6-50 MG PO TABS
2.0000 | ORAL_TABLET | Freq: Every day | ORAL | Status: DC
  Administered 2022-02-10 – 2022-02-11 (×2): 2 via ORAL
  Filled 2022-02-09 (×2): qty 2

## 2022-02-09 MED ORDER — PHENYLEPHRINE HCL-NACL 20-0.9 MG/250ML-% IV SOLN
INTRAVENOUS | Status: AC
  Filled 2022-02-09: qty 250

## 2022-02-09 MED ORDER — ACETAMINOPHEN 10 MG/ML IV SOLN
INTRAVENOUS | Status: AC
  Filled 2022-02-09: qty 100

## 2022-02-09 MED ORDER — ONDANSETRON HCL 4 MG/2ML IJ SOLN: 4.0000 mg | Freq: Three times a day (TID) | INTRAMUSCULAR | Status: DC | PRN

## 2022-02-09 MED ORDER — DIPHENHYDRAMINE HCL 50 MG/ML IJ SOLN: 12.5000 mg | INTRAMUSCULAR | Status: DC | PRN

## 2022-02-09 MED ORDER — WITCH HAZEL-GLYCERIN EX PADS: 1.0000 "application " | MEDICATED_PAD | CUTANEOUS | Status: DC | PRN

## 2022-02-09 MED ORDER — FENTANYL CITRATE (PF) 100 MCG/2ML IJ SOLN
INTRAMUSCULAR | Status: AC
Start: 2022-02-09 — End: ?
  Filled 2022-02-09: qty 2

## 2022-02-09 MED ORDER — DEXAMETHASONE SODIUM PHOSPHATE 4 MG/ML IJ SOLN
INTRAMUSCULAR | Status: DC | PRN
  Administered 2022-02-09: 8 mg via INTRAVENOUS

## 2022-02-09 MED ORDER — PRENATAL MULTIVITAMIN CH
1.0000 | ORAL_TABLET | Freq: Every day | ORAL | Status: DC
  Administered 2022-02-10 – 2022-02-11 (×2): 1 via ORAL
  Filled 2022-02-09 (×2): qty 1

## 2022-02-09 MED ORDER — SODIUM CHLORIDE 0.9 % IR SOLN
Status: DC | PRN
  Administered 2022-02-09: 1

## 2022-02-09 MED ORDER — OXYTOCIN-SODIUM CHLORIDE 30-0.9 UT/500ML-% IV SOLN
INTRAVENOUS | Status: AC
  Filled 2022-02-09: qty 500

## 2022-02-09 MED ORDER — SOD CITRATE-CITRIC ACID 500-334 MG/5ML PO SOLN
30.0000 mL | ORAL | Status: AC
  Administered 2022-02-09: 30 mL via ORAL

## 2022-02-09 MED ORDER — NALOXONE HCL 0.4 MG/ML IJ SOLN: 0.4000 mg | INTRAMUSCULAR | Status: DC | PRN

## 2022-02-09 MED ORDER — SIMETHICONE 80 MG PO CHEW
80.0000 mg | CHEWABLE_TABLET | Freq: Three times a day (TID) | ORAL | Status: DC
  Administered 2022-02-09 – 2022-02-11 (×5): 80 mg via ORAL
  Filled 2022-02-09 (×5): qty 1

## 2022-02-09 MED ORDER — KETOROLAC TROMETHAMINE 30 MG/ML IJ SOLN
30.0000 mg | Freq: Four times a day (QID) | INTRAMUSCULAR | Status: AC
  Administered 2022-02-09 – 2022-02-10 (×3): 30 mg via INTRAVENOUS
  Filled 2022-02-09 (×3): qty 1

## 2022-02-09 MED ORDER — OXYTOCIN-SODIUM CHLORIDE 30-0.9 UT/500ML-% IV SOLN: 2.5000 [IU]/h | INTRAVENOUS | Status: AC

## 2022-02-09 MED ORDER — PHENYLEPHRINE 40 MCG/ML (10ML) SYRINGE FOR IV PUSH (FOR BLOOD PRESSURE SUPPORT)
PREFILLED_SYRINGE | INTRAVENOUS | Status: AC
  Filled 2022-02-09: qty 10

## 2022-02-09 MED ORDER — METOCLOPRAMIDE HCL 5 MG/ML IJ SOLN
INTRAMUSCULAR | Status: DC | PRN
  Administered 2022-02-09: 10 mg via INTRAVENOUS

## 2022-02-09 MED ORDER — FENTANYL CITRATE (PF) 100 MCG/2ML IJ SOLN
INTRAMUSCULAR | Status: AC
  Filled 2022-02-09: qty 2

## 2022-02-09 MED ORDER — ACETAMINOPHEN 500 MG PO TABS
1000.0000 mg | ORAL_TABLET | Freq: Four times a day (QID) | ORAL | Status: DC
  Administered 2022-02-09 – 2022-02-11 (×8): 1000 mg via ORAL
  Filled 2022-02-09 (×8): qty 2

## 2022-02-09 MED ORDER — PHENYLEPHRINE HCL-NACL 20-0.9 MG/250ML-% IV SOLN
INTRAVENOUS | Status: DC | PRN
  Administered 2022-02-09: 60 ug/min via INTRAVENOUS

## 2022-02-09 MED ORDER — FENTANYL CITRATE (PF) 100 MCG/2ML IJ SOLN
INTRAMUSCULAR | Status: DC | PRN
  Administered 2022-02-09: 15 ug via INTRATHECAL

## 2022-02-09 MED ORDER — MAGNESIUM HYDROXIDE 400 MG/5ML PO SUSP: 30.0000 mL | ORAL | Status: DC | PRN

## 2022-02-09 MED ORDER — POVIDONE-IODINE 10 % EX SWAB
2.0000 "application " | Freq: Once | CUTANEOUS | Status: AC
  Administered 2022-02-09: 2 via TOPICAL

## 2022-02-09 MED ORDER — IBUPROFEN 600 MG PO TABS
600.0000 mg | ORAL_TABLET | Freq: Four times a day (QID) | ORAL | Status: DC
  Administered 2022-02-10 – 2022-02-11 (×5): 600 mg via ORAL
  Filled 2022-02-09 (×5): qty 1

## 2022-02-09 MED ORDER — DEXAMETHASONE SODIUM PHOSPHATE 4 MG/ML IJ SOLN
INTRAMUSCULAR | Status: AC
  Filled 2022-02-09: qty 1

## 2022-02-09 MED ORDER — NIFEDIPINE ER OSMOTIC RELEASE 30 MG PO TB24
30.0000 mg | ORAL_TABLET | Freq: Every day | ORAL | Status: DC
  Administered 2022-02-10 – 2022-02-11 (×2): 30 mg via ORAL
  Filled 2022-02-09 (×2): qty 1

## 2022-02-09 MED ORDER — KETOROLAC TROMETHAMINE 30 MG/ML IJ SOLN
INTRAMUSCULAR | Status: DC | PRN
Start: 2022-02-09 — End: 2022-02-09
  Administered 2022-02-09: 30 mg via INTRAVENOUS

## 2022-02-09 MED ORDER — ONDANSETRON HCL 4 MG/2ML IJ SOLN
INTRAMUSCULAR | Status: DC | PRN
  Administered 2022-02-09: 4 mg via INTRAVENOUS

## 2022-02-09 MED ORDER — BUPIVACAINE IN DEXTROSE 0.75-8.25 % IT SOLN
INTRATHECAL | Status: DC | PRN
  Administered 2022-02-09: 1.6 mL via INTRATHECAL

## 2022-02-09 MED ORDER — DIBUCAINE (PERIANAL) 1 % EX OINT: 1.0000 "application " | TOPICAL_OINTMENT | CUTANEOUS | Status: DC | PRN

## 2022-02-09 MED ORDER — OXYTOCIN-SODIUM CHLORIDE 30-0.9 UT/500ML-% IV SOLN
INTRAVENOUS | Status: DC | PRN
  Administered 2022-02-09: 300 mL via INTRAVENOUS

## 2022-02-09 MED ORDER — STERILE WATER FOR IRRIGATION IR SOLN
Status: DC | PRN
  Administered 2022-02-09: 1

## 2022-02-09 MED ORDER — ENOXAPARIN SODIUM 60 MG/0.6ML IJ SOSY
50.0000 mg | PREFILLED_SYRINGE | INTRAMUSCULAR | Status: DC
  Administered 2022-02-10 – 2022-02-11 (×2): 50 mg via SUBCUTANEOUS
  Filled 2022-02-09 (×2): qty 0.6

## 2022-02-09 MED ORDER — FENTANYL CITRATE (PF) 100 MCG/2ML IJ SOLN: 25.0000 ug | INTRAMUSCULAR | Status: DC | PRN

## 2022-02-09 MED ORDER — SCOPOLAMINE 1 MG/3DAYS TD PT72
MEDICATED_PATCH | TRANSDERMAL | Status: DC | PRN
  Administered 2022-02-09: 1 via TRANSDERMAL

## 2022-02-09 MED ORDER — SCOPOLAMINE 1 MG/3DAYS TD PT72: 1.0000 | MEDICATED_PATCH | Freq: Once | TRANSDERMAL | Status: DC

## 2022-02-09 SURGICAL SUPPLY — 40 items
BENZOIN TINCTURE PRP APPL 2/3 (GAUZE/BANDAGES/DRESSINGS) ×1 IMPLANT
CHLORAPREP W/TINT 26ML (MISCELLANEOUS) ×2 IMPLANT
CLAMP CORD UMBIL (MISCELLANEOUS) IMPLANT
CLOTH BEACON ORANGE TIMEOUT ST (SAFETY) ×2 IMPLANT
DERMABOND ADHESIVE PROPEN (GAUZE/BANDAGES/DRESSINGS) ×1
DERMABOND ADVANCED .7 DNX6 (GAUZE/BANDAGES/DRESSINGS) IMPLANT
DRSG OPSITE POSTOP 4X10 (GAUZE/BANDAGES/DRESSINGS) ×2 IMPLANT
ELECT REM PT RETURN 9FT ADLT (ELECTROSURGICAL) ×2
ELECTRODE REM PT RTRN 9FT ADLT (ELECTROSURGICAL) ×1 IMPLANT
EXTRACTOR VACUUM BELL STYLE (SUCTIONS) IMPLANT
GAUZE 4X4 16PLY ~~LOC~~+RFID DBL (SPONGE) ×1 IMPLANT
GAUZE SPONGE 4X4 12PLY STRL LF (GAUZE/BANDAGES/DRESSINGS) ×2 IMPLANT
GLOVE BIOGEL PI IND STRL 7.0 (GLOVE) ×2 IMPLANT
GLOVE BIOGEL PI INDICATOR 7.0 (GLOVE) ×2
GLOVE ECLIPSE 7.0 STRL STRAW (GLOVE) ×2 IMPLANT
GOWN STRL REUS W/TWL LRG LVL3 (GOWN DISPOSABLE) ×4 IMPLANT
HEMOSTAT ARISTA ABSORB 3G PWDR (HEMOSTASIS) ×1 IMPLANT
KIT ABG SYR 3ML LUER SLIP (SYRINGE) ×2 IMPLANT
MAT PREVALON FULL STRYKER (MISCELLANEOUS) ×1 IMPLANT
NDL HYPO 25X5/8 SAFETYGLIDE (NEEDLE) ×1 IMPLANT
NEEDLE HYPO 25X5/8 SAFETYGLIDE (NEEDLE) ×2 IMPLANT
NS IRRIG 1000ML POUR BTL (IV SOLUTION) ×2 IMPLANT
PACK C SECTION WH (CUSTOM PROCEDURE TRAY) ×2 IMPLANT
PAD ABD 7.5X8 STRL (GAUZE/BANDAGES/DRESSINGS) ×1 IMPLANT
PAD OB MATERNITY 4.3X12.25 (PERSONAL CARE ITEMS) ×2 IMPLANT
RTRCTR C-SECT PINK 25CM LRG (MISCELLANEOUS) ×2 IMPLANT
SUT MNCRL 0 VIOLET CTX 36 (SUTURE) ×2 IMPLANT
SUT MONOCRYL 0 CTX 36 (SUTURE) ×2
SUT PLAIN 0 NONE (SUTURE) IMPLANT
SUT PLAIN 2 0 (SUTURE)
SUT PLAIN 2 0 XLH (SUTURE) ×1 IMPLANT
SUT PLAIN ABS 2-0 CT1 27XMFL (SUTURE) IMPLANT
SUT VIC AB 0 CTX 36 (SUTURE) ×1
SUT VIC AB 0 CTX36XBRD ANBCTRL (SUTURE) ×1 IMPLANT
SUT VIC AB 2-0 CT1 27 (SUTURE) ×1
SUT VIC AB 2-0 CT1 TAPERPNT 27 (SUTURE) ×1 IMPLANT
SUT VIC AB 4-0 KS 27 (SUTURE) ×2 IMPLANT
TOWEL OR 17X24 6PK STRL BLUE (TOWEL DISPOSABLE) ×2 IMPLANT
TRAY FOLEY W/BAG SLVR 14FR LF (SET/KITS/TRAYS/PACK) IMPLANT
WATER STERILE IRR 1000ML POUR (IV SOLUTION) ×2 IMPLANT

## 2022-02-09 NOTE — Lactation Note (Signed)
This note was copied from a baby's chart. ?Lactation Consultation Note ?Mom chooses to formula feed. ? ?Patient Name: Taylor Chang ?Today's Date: 02/09/2022 ?  ?Age:27 hours ? ?Maternal Data ?  ? ?Feeding ?Mother's Current Feeding Choice: Formula ? ?LATCH Score ?  ? ?  ? ?  ? ?  ? ?  ? ?  ? ? ?Lactation Tools Discussed/Used ?  ? ?Interventions ?  ? ?Discharge ?  ? ?Consult Status ?Consult Status: Complete ? ? ? ?Charyl Dancer ?02/09/2022, 8:49 PM ? ? ? ?

## 2022-02-09 NOTE — H&P (Signed)
LABOR AND DELIVERY ADMISSION HISTORY AND PHYSICAL NOTE ? ?Taylor Chang is a 26 y.o. female [phone] with IUP at 65w0dby 6wk presenting for repeat cesarean for prior history of severe pre e with fetal demise at 37 weeks in last pregnancy.  ? ?She reports positive fetal movement. She denies leakage of fluid or vaginal bleeding.  ? ?She plans on bottle feeding. She requests birth control pills vs Depo for birth control. ? ?Prenatal History/Complications: ?[facility] ?Sono:  @[redacted]w[redacted]d , CWD, normal anatomy, cephalic presentation, posterior placenta, 33%ile, EFW 2656g ? ?Pregnancy complications:  ?- cHTN on labetalol 2065mBID ?- history of one prior cesarean in setting of severe PreE with abruption and fetal demise 11/2020 ?- Rh neg ?- Trichomonas ? ?Past Medical History: ?Past Medical History:  ?Diagnosis Date  ? Blood transfusion without reported diagnosis   ? Hypertension 2022  ? Trichomoniasis 01/14/2022  ? Vaginal Pap smear, abnormal   ? ? ?Past Surgical History: ?Past Surgical History:  ?Procedure Laterality Date  ? CESAREAN SECTION  11/2020  ? [place]  ? ? ?Obstetrical History: ?OB History   ? ? Gravida  ?7  ? Para  ?3  ? Term  ?3  ? Preterm  ?   ? AB  ?3  ? Living  ?2  ?  ? ? SAB  ?1  ? IAB  ?1  ? Ectopic  ?   ? Multiple  ?   ? Live Births  ?2  ?   ?  ?  ? ? ?Social History: ?Social History  ? ?Socioeconomic History  ? Marital status: Significant Other  ?  Spouse name: Not on file  ? Number of children: Not on file  ? Years of education: Not on file  ? Highest education level: Not on file  ?Occupational History  ? Not on file  ?Tobacco Use  ? Smoking status: Never  ? Smokeless tobacco: Never  ?Vaping Use  ? Vaping Use: Never used  ?Substance and Sexual Activity  ? Alcohol use: No  ? Drug use: No  ? Sexual activity: Yes  ?  Partners: Male  ?  Birth control/protection: None  ?Other Topics Concern  ? Not on file  ?Social History Narrative  ? Not on file  ? ?Social Determinants of Health   ? ?Financial Resource Strain: Not on file  ?Food Insecurity: Not on file  ?Transportation Needs: Not on file  ?Physical Activity: Not on file  ?Stress: Not on file  ?Social Connections: Not on file  ? ? ?Family History: ?Family History  ?Problem Relation Age of Onset  ? Hypertension Maternal Grandmother   ? ? ?Allergies: ?No Known Allergies ? ?Medications Prior to Admission  ?Medication Sig Dispense Refill Last Dose  ? aspirin EC 81 MG tablet Take 1 tablet (81 mg total) by mouth daily. Take after 12 weeks for prevention of preeclampsia later in pregnancy 300 tablet 2 02/08/2022  ? Iron, Ferrous Sulfate, 325 (65 Fe) MG TABS Take 1 tablet by mouth every other day. (Patient taking differently: Take 1 tablet by mouth every other day. At noon) 30 tablet 2 02/08/2022  ? labetalol (NORMODYNE) 200 MG tablet TAKE 1 TABLET(200 MG) BY MOUTH TWICE DAILY 60 tablet 3 02/09/2022 at 0700  ? Prenatal Vit-Fe Fumarate-FA (PREPLUS) 27-1 MG TABS Take 1 tablet by mouth daily. (Patient taking differently: Take 1 tablet by mouth at bedtime.) 30 tablet 13 02/08/2022  ? Blood Pressure Monitoring (BLOOD PRESSURE KIT) DEVI 1  kit by Does not apply route once a week. 1 each 0   ? ? ? ?Review of Systems  ?All systems reviewed and negative except as stated in HPI ? ?Physical Exam ?Blood pressure (!) 152/85, pulse 85, temperature 98.5 ?F (36.9 ?C), temperature source Oral, resp. rate 16, height 5' 5"  (1.651 m), weight 95.3 kg, last menstrual period 05/26/2021, SpO2 100 %, unknown if currently breastfeeding. ?General appearance: alert, oriented, NAD ?Lungs: normal respiratory effort ?Heart: regular rate ?Abdomen: soft, non-tender; gravid ?Extremities: No calf swelling or tenderness ?FHR: 138 bpm ? ?Prenatal labs: ?ABO, Rh: --/--/O NEG ?Performed at [facility], [place] [address] [place], [place] [address] ? (03/27 2119) ?Antibody: POS (03/24 4174) ?Rubella: 1.11 (10/06 1125) ?RPR: NON REACTIVE (03/24 0942)  ?HBsAg: Negative (10/06 1125)  ?HIV: Non  Reactive (01/18 0954)  ?GC/Chlamydia:  ?Neisseria Gonorrhea  ?Date Value Ref Range Status  ?02/04/2022 Negative  Final  ? ?Chlamydia  ?Date Value Ref Range Status  ?02/04/2022 Negative  Final  ?  ?GBS: Positive/-- (03/22 1613)  ?2-hr GTT: normal ?Genetic screening:  low risk ?Anatomy US: normal ? ?Prenatal Transfer Tool  ?Maternal Diabetes: No ?Genetic Screening: Normal ?Maternal Ultrasounds/Referrals: Isolated EIF (echogenic intracardiac focus) ?Fetal Ultrasounds or other Referrals:  None ?Maternal Substance Abuse:  No ?Significant Maternal Medications:  Meds include: Other: Labetalol ?Significant Maternal Lab Results: Group B Strep positive, Rh negative, and Other: Trichomonas+ 12/2021, TOC pending ? ?Results for orders placed or performed during the hospital encounter of 02/09/22 (from the past 24 hour(s))  ?ABO/Rh  ? Collection Time: 02/09/22  8:02 AM  ?Result Value Ref Range  ? ABO/RH(D)    ?  O NEG ?Performed at [facility], [place] [address], [place], [place] [address] ?  ? ? ?Patient Active Problem List  ? Diagnosis Date Noted  ? GBS (group B Streptococcus carrier), +RV culture, currently pregnant 02/08/2022  ? Elevated serum creatinine 02/07/2022  ? Rh negative state in antepartum period 12/31/2021  ? Trichomonas vaginalis infection 12/31/2021  ? Atypical glandular cells of undetermined significance (AGUS) on cervical Pap smear 09/18/2021  ? Chronic hypertension affecting pregnancy 08/21/2021  ? Prior pregnancy with fetal demise and current pregnancy 08/21/2021  ? H/O cesarean section 08/21/2021  ? Supervision of other high risk pregnancy, antepartum 08/14/2021  ? ? ?Assessment: ?Taylor Chang is a 26 y.o. [id number] at 68w0dhere for scheduled CS. ? ?#RCS:  ?The risks of cesarean section discussed with the patient included but were not limited to: bleeding which may require transfusion or reoperation; infection which may require antibiotics; injury to bowel, bladder, ureters or other surrounding organs;  injury to the fetus; need for additional procedures including hysterectomy in the event of a life-threatening hemorrhage; placental abnormalities with subsequent pregnancies, incisional problems, thromboembolic phenomenon and other postoperative/anesthesia complications. The patient concurred with the proposed plan, giving informed written consent for the procedure. Patient has been NPO since last night she will remain NPO for procedure. Anesthesia and OR aware. Preoperative prophylactic antibiotics and SCDs ordered on call to the OR. To OR when ready.  ? ? ?#Anesthesia: Spinal ?#FWB: FHR 138 bpm ?#GBS/ID: positive ?#MOF: Bottle ?#MOC: pills vs Depo, TBD ?#Circ: n/a ? ?#Trichomonas: send TOC after delivery ? ?#Rh neg: rhogam workup PP ? ?#cHTN: cont labetalol 2069mBID, suspect will need uptitration based on initial admit BP ? ? ? ?MaClarnce Flock3/27/2023, 9:10 AM ? ?

## 2022-02-09 NOTE — Anesthesia Procedure Notes (Signed)
Spinal ? ?Patient location during procedure: OR ?Start time: 02/09/2022 10:42 AM ?End time: 02/09/2022 10:49 AM ?Reason for block: surgical anesthesia ?Staffing ?Performed: anesthesiologist  ?Anesthesiologist: Val Eagle, MD ?Preanesthetic Checklist ?Completed: patient identified, IV checked, risks and benefits discussed, surgical consent, monitors and equipment checked, pre-op evaluation and timeout performed ?Spinal Block ?Patient position: sitting ?Prep: DuraPrep ?Patient monitoring: heart rate, cardiac monitor, continuous pulse ox and blood pressure ?Approach: midline ?Location: L4-5 ?Injection technique: single-shot ?Needle ?Needle type: Pencan  ?Needle gauge: 24 G ?Needle length: 9 cm ?Assessment ?Sensory level: T6 ?Events: CSF return ? ? ? ?

## 2022-02-09 NOTE — Anesthesia Postprocedure Evaluation (Signed)
Anesthesia Post Note ? ?Patient: Taylor Chang ? ?Procedure(s) Performed: CESAREAN SECTION ? ?  ? ?Patient location during evaluation: PACU ?Anesthesia Type: Spinal ?Level of consciousness: awake and alert ?Pain management: pain level controlled ?Vital Signs Assessment: post-procedure vital signs reviewed and stable ?Respiratory status: spontaneous breathing, nonlabored ventilation and respiratory function stable ?Cardiovascular status: blood pressure returned to baseline and stable ?Postop Assessment: no apparent nausea or vomiting ?Anesthetic complications: no ? ? ?No notable events documented. ? ?Last Vitals:  ?Vitals:  ? 02/09/22 1333 02/09/22 1438  ?BP: 117/63 124/85  ?Pulse: (!) 101 (!) 57  ?Resp: 16 16  ?Temp: 36.5 ?C 36.4 ?C  ?SpO2: 100% 100%  ?  ?Last Pain:  ?Vitals:  ? 02/09/22 1438  ?TempSrc: Oral  ?PainSc: 0-No pain  ? ?Pain Goal:   ? ?  ?  ?  ?  ?  ?  ?Epidural/Spinal Function Cutaneous sensation: Able to Wiggle Toes (02/09/22 1438), Patient able to flex knees: Yes (02/09/22 1438), Patient able to lift hips off bed: Yes (02/09/22 1438), Back pain beyond tenderness at insertion site: No (02/09/22 1438), Progressively worsening motor and/or sensory loss: No (02/09/22 1438), Bowel and/or bladder incontinence post epidural: No (02/09/22 1438) ? ?[name] ? ? ? ? ?

## 2022-02-09 NOTE — Discharge Summary (Signed)
? ?  Postpartum Discharge Summary ? ?   ?Patient Name: Taylor Chang ?DOB: 02/26/1996 ?MRN: [id number] ? ?Date of admission: 02/09/2022 ?Delivery date:02/09/2022  ?Delivering provider: Clarnce Flock  ?Date of discharge: 02/11/2022 ? ?Admitting diagnosis: History of placental abruption [Z87.59] ?Intrauterine pregnancy: [redacted]w[redacted]d    ?Secondary diagnosis:  Principal Problem: ?  Status post repeat low transverse cesarean section ?Active Problems: ?  Chronic hypertension affecting pregnancy ?  H/O cesarean section ?  Rh negative state in antepartum period ?  GBS (group B Streptococcus carrier), +RV culture, currently pregnant ?  History of placental abruption ? ?Additional problems: Acute blood loss anemia s/p IV Venofer    ?Discharge diagnosis: Term Pregnancy Delivered and CHTN                                              ?Post partum procedures: None  ?Augmentation: N/A ?Complications: None ? ?Hospital course: Sceduled C/S - 26y.o. yo [id number] [id number] admitted to the hospital 02/09/2022 for scheduled cesarean section with the following indication: Elective repeat in the setting of cHTN and previous placental abruption . Delivery details are as follows:  ?Membrane Rupture Time/Date: 11:19 AM ,02/09/2022   ?Delivery Method:C-Section, Low Transverse  ?Details of operation can be found in separate operative note.  Patient had an uncomplicated postpartum course.  Her hemoglobin on POD#1 was 8.6, down from 9.7, for which she received IV Venofer. She remained asymptomatic while inpatient.  She was started on Procardia 30 mg for BP control and will continue this in addition to a 5 day course of Lasix upon discharge.  She is ambulating, tolerating a regular diet, passing flatus, and urinating well. Patient is discharged home in stable condition on  02/11/22 ?       ?Newborn Data: ?Birth date:02/09/2022  ?Birth time:11:21 AM  ?Gender:Female  ?Living status:Living  ?Apgars:8 ,9  ?Weight:2590 g    ? ?Magnesium Sulfate received:  No ?BMZ received: No ?Rhophylac: Yes ?MMR: N/A ?T-DaP: Given prenatally ?Flu: No ?Transfusion: No ? ?Physical exam  ?Vitals:  ? 02/10/22 1236 02/10/22 1839 02/10/22 2031 02/11/22 0546  ?BP: 131/74 130/66 (!) 134/59 133/75  ?Pulse: 72 73 70 72  ?Resp: 16 16 18 18   ?Temp: 98 ?F (36.7 ?C) 98.1 ?F (36.7 ?C) 98.4 ?F (36.9 ?C) 97.8 ?F (36.6 ?C)  ?TempSrc: Oral Oral Oral Oral  ?SpO2:   100% 100%  ?Weight:      ?Height:      ? ?General: alert, cooperative, and no distress ?Lochia: appropriate ?Uterine Fundus: firm and below umbilicus  ?Incision: healing well with no significant drainage, dressing is clean, dry, and intact ?DVT Evaluation: no LE edema or calf tenderness to palpation  ? ?Labs: ?Lab Results  ?Component Value Date  ? WBC 17.0 (H) 02/10/2022  ? HGB 8.6 (L) 02/10/2022  ? HCT 26.0 (L) 02/10/2022  ? MCV 97.0 02/10/2022  ? PLT 180 02/10/2022  ? ? ?  Latest Ref Rng & Units 02/07/2022  ?  6:56 AM  ?CMP  ?Glucose 70 - 99 mg/dL 78    ?BUN 6 - 20 mg/dL 8    ?Creatinine 0.44 - 1.00 mg/dL 0.85    ?Sodium 135 - 145 mmol/L 134    ?Potassium 3.5 - 5.1 mmol/L 3.7    ?Chloride 98 - 111 mmol/L 106    ?CO2 22 - 32  mmol/L 24    ?Calcium 8.9 - 10.3 mg/dL 8.7    ?Total Protein 6.5 - 8.1 g/dL 6.3    ?Total Bilirubin 0.3 - 1.2 mg/dL 0.5    ?Alkaline Phos 38 - 126 U/L 79    ?AST 15 - 41 U/L 19    ?ALT 0 - 44 U/L 14    ? ?Edinburgh Score: ? ?  02/09/2022  ?  5:42 PM  ?Edinburgh Postnatal Depression Scale Screening Tool  ?I have been able to laugh and see the funny side of things. 0  ?I have looked forward with enjoyment to things. 0  ?I have blamed myself unnecessarily when things went wrong. 0  ?I have been anxious or worried for no good reason. 0  ?I have felt scared or panicky for no good reason. 0  ?Things have been getting on top of me. 0  ?I have been so unhappy that I have had difficulty sleeping. 0  ?I have felt sad or miserable. 0  ?I have been so unhappy that I have been crying. 0  ?The thought of harming myself has occurred to  me. 0  ?Edinburgh Postnatal Depression Scale Total 0  ? ? ? ?After visit meds:  ?Allergies as of 02/11/2022   ?No Known Allergies ?  ? ?  ?Medication List  ?  ? ?STOP taking these medications   ? ?aspirin EC 81 MG tablet ?  ?labetalol 200 MG tablet ?Commonly known as: NORMODYNE ?  ? ?  ? ?TAKE these medications   ? ?acetaminophen 500 MG tablet ?Commonly known as: TYLENOL ?Take 2 tablets (1,000 mg total) by mouth every 6 (six) hours as needed (pain). ?  ?Blood Pressure Kit Devi ?1 kit by Does not apply route once a week. ?  ?furosemide 20 MG tablet ?Commonly known as: Lasix ?Take 1 tablet (20 mg total) by mouth daily for 5 days. ?  ?ibuprofen 600 MG tablet ?Commonly known as: ADVIL ?Take 1 tablet (600 mg total) by mouth every 6 (six) hours as needed (pain). ?  ?Iron (Ferrous Sulfate) 325 (65 Fe) MG Tabs ?Take 1 tablet by mouth every other day. ?What changed: additional instructions ?  ?NIFEdipine 30 MG 24 hr tablet ?Commonly known as: ADALAT CC ?Take 1 tablet (30 mg total) by mouth daily. ?  ?oxyCODONE 5 MG immediate release tablet ?Commonly known as: Oxy IR/ROXICODONE ?Take 1 tablet (5 mg total) by mouth every 6 (six) hours as needed for severe pain or breakthrough pain. ?  ?PrePLUS 27-1 MG Tabs ?Take 1 tablet by mouth daily. ?What changed: when to take this ?  ? ?  ? ?  ?  ? ? ?  ?Discharge Care Instructions  ?(From admission, onward)  ?  ? ? ?  ? ?  Start     Ordered  ? 02/11/22 0000  Discharge wound care:       ?Comments: Remove dressing 5 days after your surgery date. You can then wash gently with soap and water in the shower and pat dry. You will have an incision check in about 1 week.  ? 02/11/22 1148  ? ?  ?  ? ?  ? ? ? ?Discharge home in stable condition ?Infant Feeding: Bottle ?Infant Disposition: home with mother ?Discharge instruction: per After Visit Summary and Postpartum booklet. ?Activity: Advance as tolerated. Pelvic rest for 6 weeks.  ?Diet: routine diet ?Future Appointments: ?Future Appointments   ?Date Time Provider [place]  ?02/18/2022 10:00 AM [place]  None  ?03/31/2022  2:10 PM Renard Matter, MD [facility] None  ? ?Follow up Visit: ?Message sent to [facility] by Dr Higinio Plan:  ?Please schedule this patient for a In person postpartum visit in 6 weeks with the following provider: Any provider. ?Additional Postpartum F/U: Incision check 1 week and BP check 1 week  ?High risk pregnancy complicated by: HTN ?Delivery mode:  C-Section, Low Transverse  ?Anticipated Birth Control:   Depo vs POPs, plans to decide at postpartum visit ? ?02/11/2022 ?Genia Del, MD ? ? ? ?

## 2022-02-09 NOTE — Op Note (Signed)
Taylor Chang ?PROCEDURE DATE: 02/09/2022 ? ?PREOPERATIVE DIAGNOSIS: Intrauterine pregnancy at  [redacted]w[redacted]d weeks gestation;  history of placental abruption with fetal demise at 37 weeks complicated by DIC in last pregnancy ? ?POSTOPERATIVE DIAGNOSIS: The same ? ?PROCEDURE: Repeat Low Transverse Cesarean Section ? ?SURGEON:  Dr. Merian Capron  ? ?ASSISTANT: Dr Leticia Penna  ? ?INDICATIONS: Taylor Chang is a 26 y.o. [id number] at [redacted]w[redacted]d scheduled for cesarean section secondary to  the above indications .  The risks of cesarean section discussed with the patient included but were not limited to: bleeding which may require transfusion or reoperation; infection which may require antibiotics; injury to bowel, bladder, ureters or other surrounding organs; injury to the fetus; need for additional procedures including hysterectomy in the event of a life-threatening hemorrhage; placental abnormalities wth subsequent pregnancies, incisional problems, thromboembolic phenomenon and other postoperative/anesthesia complications. The patient concurred with the proposed plan, giving informed written consent for the procedure.   ? ?FINDINGS:  Viable female infant in cephalic presentation.  Apgars 8 and 9, weight, 2590 grams.  Clear amniotic fluid.  Intact placenta, three vessel cord.  Normal uterus, fallopian tubes and ovaries bilaterally. ? ?ANESTHESIA:    Spinal ?INTRAVENOUS FLUIDS:1000 ml ?ESTIMATED BLOOD LOSS: 661 ml ?URINE OUTPUT:  800 ml ?SPECIMENS: Placenta sent to L&D ?COMPLICATIONS: None immediate ? ?PROCEDURE IN DETAIL:  The patient received intravenous antibiotics and had sequential compression devices applied to her lower extremities while in the preoperative area.  She was then taken to the operating room where spinal anesthesia was administered (epidural anesthesia was dosed up to surgical level) and was found to be adequate. She was then placed in a dorsal supine position with a leftward tilt, and prepped and draped  in a sterile manner.  A foley catheter was placed into her bladder and attached to constant gravity, which drained clear fluid throughout.  After an adequate timeout was performed, a Pfannenstiel skin incision was made with scalpel and carried through to the underlying layer of fascia. The fascia was incised in the midline and this incision was extended bilaterally using the Mayo scissors. Kocher clamps were applied to the superior aspect of the fascial incision and the underlying rectus muscles were dissected off bluntly. A similar process was carried out on the inferior aspect of the facial incision. Overall there was a moderate amount of adhesive disease in this layer. The rectus muscles were separated in the midline bluntly and the peritoneum was entered bluntly. An Alexis retractor was placed to aid in visualization of the uterus. Some tight fibrosis of the peritoneum had to be lysed but otherwise there was no significant intra-abdominal adhesive disease.  Attention was turned to the lower uterine segment where a transverse hysterotomy was made with a scalpel and extended bilaterally bluntly. The infant was successfully delivered, and cord was clamped and cut and infant was handed over to awaiting neonatology team. Uterine massage was then administered and the placenta delivered intact with three-vessel cord. The uterus was then cleared of clot and debris.  The hysterotomy was closed with 0 Monocryl in a running locked fashion, and an imbricating layer was also placed with a 0 Monocryl. Due to persistent bleeding at bilateral hysterotomy corners figure of eight sutures were placed with good effect. Overall, excellent hemostasis was noted, but a layer of Arista was placed for good measure. The abdomen and the pelvis were cleared of all clot and debris and the Jon Gills was removed. Hemostasis was confirmed on all surfaces.  The peritoneum was not  reapproximated. Some bleeding on the peritoneum and rectus muscles  was controlled with a combination of bovie and Arista. The fascia was then closed using 0 Vicryl in a running fashion. The subcutaneous layer was reapproximated with plain gut and the skin was closed with 4-0 vicryl. The patient tolerated the procedure well. Sponge, lap, instrument and needle counts were correct x 2. She was taken to the recovery room in stable condition.  ? ? ?[name], DO ?02/09/2022 12:18 PM  ? ? ?Attestation of Attending Supervision of Obstetric Fellow During Surgery: An experienced assistant was required given the standard of surgical care given the complexity of the case.  This assistant was needed for exposure, dissection, suctioning, retraction, instrument exchange, assisting with delivery with administration of fundal pressure, and for overall help during the procedure. Surgery was performed with the Obstetric Fellow under my supervision and collaboration.  I was present and scrubbed for the entire procedure.   I have reviewed the Obstetric Fellow's operative report, and I agree with the documentation. I have also made any necessary editorial changes. ? ?Venora Maples, MD/MPH ?Attending Family Medicine Physician, Faculty Practice ?Center for [employer], [facility] ? ?

## 2022-02-09 NOTE — Anesthesia Preprocedure Evaluation (Signed)
Anesthesia Evaluation  ?Patient identified by MRN, date of birth, ID band ?Patient awake ? ? ? ?Reviewed: ?Allergy & Precautions, NPO status , Patient's Chart, lab work & pertinent test results, reviewed documented beta blocker date and time  ? ?History of Anesthesia Complications ?Negative for: history of anesthetic complications ? ?Airway ?Mallampati: II ? ?TM Distance: >3 FB ?Neck ROM: Full ? ? ? Dental ? ?(+) Teeth Intact, Dental Advisory Given ?  ?Pulmonary ?neg pulmonary ROS,  ?  ?breath sounds clear to auscultation ? ? ? ? ? ? Cardiovascular ?hypertension, Pt. on medications and Pt. on home beta blockers ?(-) angina(-) Past MI  ?Rhythm:Regular  ? ?  ?Neuro/Psych ?negative neurological ROS ? negative psych ROS  ? GI/Hepatic ?negative GI ROS, Neg liver ROS,   ?Endo/Other  ?negative endocrine ROS ? Renal/GU ?negative Renal ROS  ? ?  ?Musculoskeletal ?negative musculoskeletal ROS ?(+)  ? Abdominal ?  ?Peds ? Hematology ?negative hematology ROS ?(+) Lab Results ?     Component                Value               Date                 ?     WBC                      8.0                 02/07/2022           ?     HGB                      9.7 (L)             02/07/2022           ?     HCT                      29.3 (L)            02/07/2022           ?     MCV                      97.7                02/07/2022           ?     PLT                      181                 02/07/2022           ? ?Denies blood thinners   ?Anesthesia Other Findings ?Braces upper/lower no wires in ? Reproductive/Obstetrics ?(+) Pregnancy ? ?  ? ? ? ? ? ? ? ? ? ? ? ? ? ?  ?  ? ? ? ? ? ? ? ? ?Anesthesia Physical ?Anesthesia Plan ? ?ASA: 2 ? ?Anesthesia Plan: Spinal  ? ?Post-op Pain Management:   ? ?Induction:  ? ?PONV Risk Score and Plan: 2 and Ondansetron, Dexamethasone and Scopolamine patch - Pre-op ? ?Airway Management Planned: Natural Airway ? ?Additional Equipment: None ? ?Intra-op Plan:  ? ?Post-operative  Plan:  ? ?Informed Consent: I have reviewed the patients History and Physical, chart,  labs and discussed the procedure including the risks, benefits and alternatives for the proposed anesthesia with the patient or authorized representative who has indicated his/her understanding and acceptance.  ? ? ? ?Dental advisory given ? ?Plan Discussed with: CRNA and Anesthesiologist ? ?Anesthesia Plan Comments:   ? ? ? ? ? ? ?Anesthesia Quick Evaluation ? ?

## 2022-02-09 NOTE — Transfer of Care (Signed)
Immediate Anesthesia Transfer of Care Note ? ?Patient: Taylor Chang ? ?Procedure(s) Performed: CESAREAN SECTION ? ?Patient Location: PACU ? ?Anesthesia Type:Spinal ? ?Level of Consciousness: awake, alert  and oriented ? ?Airway & Oxygen Therapy: Patient Spontanous Breathing ? ?Post-op Assessment: Report given to RN and Post -op Vital signs reviewed and stable ? ?Post vital signs: Reviewed and stable ? ?Last Vitals:  ?Vitals Value Taken Time  ?BP 115/85 02/09/22 1228  ?Temp    ?Pulse 71 02/09/22 1230  ?Resp 11 02/09/22 1230  ?SpO2 100 % 02/09/22 1230  ?Vitals shown include unvalidated device data. ? ?Last Pain:  ?Vitals:  ? 02/09/22 0758  ?TempSrc: Oral  ?   ? ?  ? ?Complications: No notable events documented. ?

## 2022-02-10 LAB — TYPE AND SCREEN
ABO/RH(D): O NEG
Antibody Screen: POSITIVE
Unit division: 0
Unit division: 0

## 2022-02-10 LAB — BPAM RBC
Blood Product Expiration Date: 202304252359
Blood Product Expiration Date: 202304262359
Unit Type and Rh: 9500
Unit Type and Rh: 9500

## 2022-02-10 LAB — CBC
HCT: 26 % — ABNORMAL LOW (ref 36.0–46.0)
Hemoglobin: 8.6 g/dL — ABNORMAL LOW (ref 12.0–15.0)
MCH: 32.1 pg (ref 26.0–34.0)
MCHC: 33.1 g/dL (ref 30.0–36.0)
MCV: 97 fL (ref 80.0–100.0)
Platelets: 180 10*3/uL (ref 150–400)
RBC: 2.68 MIL/uL — ABNORMAL LOW (ref 3.87–5.11)
RDW: 12.9 % (ref 11.5–15.5)
WBC: 17 10*3/uL — ABNORMAL HIGH (ref 4.0–10.5)
nRBC: 0 % (ref 0.0–0.2)

## 2022-02-10 MED ORDER — SODIUM CHLORIDE 0.9 % IV SOLN
500.0000 mg | Freq: Once | INTRAVENOUS | Status: AC
  Administered 2022-02-10: 500 mg via INTRAVENOUS
  Filled 2022-02-10: qty 25

## 2022-02-10 MED ORDER — RHO D IMMUNE GLOBULIN 1500 UNIT/2ML IJ SOSY
300.0000 ug | PREFILLED_SYRINGE | Freq: Once | INTRAMUSCULAR | Status: AC
  Administered 2022-02-10: 300 ug via INTRAVENOUS
  Filled 2022-02-10: qty 2

## 2022-02-10 NOTE — Progress Notes (Signed)
POSTPARTUM PROGRESS NOTE ? ?Subjective: Taylor Chang is a 26 y.o. [id number] POD 1 s/p rLTCS at [redacted]w[redacted]d.  She reports she doing well. No acute events overnight. She denies any problems with ambulating, voiding or po intake. Denies nausea or vomiting. She has  passed flatus. Pain is well controlled.  Lochia is scant. ? ?Objective: ?Blood pressure 124/72, pulse 68, temperature 97.7 ?F (36.5 ?C), temperature source Oral, resp. rate 20, height 5\' 5"  (1.651 m), weight 95.3 kg, last menstrual period 05/26/2021, SpO2 100 %, unknown if currently breastfeeding. ? ?Physical Exam:  ?General: alert, cooperative and no distress ?Chest: no respiratory distress ?Abdomen: soft, non-tender  incision dressing clean dry, and intact.  ?Uterine Fundus: firm, appropriately tender ?Extremities: No calf swelling or tenderness  no edema ? ?Recent Labs  ?  02/10/22 ?0420  ?HGB 8.6*  ?HCT 26.0*  ? ? ?Assessment/Plan: ?Taylor Chang is a 26 y.o. [id number] POD 1 s/p rLTCS at [redacted]w[redacted]d.   ?POD#1: Doing well, pain well-controlled. ?-- Routine postpartum care ?-- Encouraged up OOB ?-- Lovenox for VTE prophylaxis ? ?#acute blood loss anemia ?Hgb 9.7 to 8.6. Asymptomatic. We recommend IV iron  ? ?Routine Postpartum Care ?-- Contraception: Depo outpatient at 6 week visit. ?-- Feeding: Formula ? ?Dispo: Plan for discharge at POD#2. ? ?Prince Solian ?[employer] ?[facility] [place]  ?  ?

## 2022-02-11 ENCOUNTER — Other Ambulatory Visit (HOSPITAL_COMMUNITY): Payer: Self-pay

## 2022-02-11 DIAGNOSIS — Z98891 History of uterine scar from previous surgery: Secondary | ICD-10-CM

## 2022-02-11 HISTORY — DX: History of uterine scar from previous surgery: Z98.891

## 2022-02-11 LAB — RH IG WORKUP (INCLUDES ABO/RH)
Antibody Screen: POSITIVE
Fetal Screen: NEGATIVE
Gestational Age(Wks): 37
Unit division: 0

## 2022-02-11 MED ORDER — NIFEDIPINE ER 30 MG PO TB24
30.0000 mg | ORAL_TABLET | Freq: Every day | ORAL | 0 refills | Status: DC
  Filled 2022-02-11: qty 60, 60d supply, fill #0

## 2022-02-11 MED ORDER — OXYCODONE HCL 5 MG PO TABS
5.0000 mg | ORAL_TABLET | Freq: Four times a day (QID) | ORAL | 0 refills | Status: DC | PRN
  Filled 2022-02-11: qty 20, 5d supply, fill #0

## 2022-02-11 MED ORDER — FUROSEMIDE 20 MG PO TABS
20.0000 mg | ORAL_TABLET | Freq: Every day | ORAL | 0 refills | Status: DC
  Filled 2022-02-11: qty 5, 5d supply, fill #0

## 2022-02-11 MED ORDER — ACETAMINOPHEN 500 MG PO TABS
1000.0000 mg | ORAL_TABLET | Freq: Four times a day (QID) | ORAL | 0 refills | Status: DC | PRN
  Filled 2022-02-11: qty 60, 8d supply, fill #0

## 2022-02-11 MED ORDER — IBUPROFEN 600 MG PO TABS
600.0000 mg | ORAL_TABLET | Freq: Four times a day (QID) | ORAL | 0 refills | Status: DC | PRN
  Filled 2022-02-11: qty 40, 10d supply, fill #0

## 2022-02-11 NOTE — Progress Notes (Signed)
CSW acknowledged consult for MOB for fetal demise in 2022.  CSW reached out to RN to confirm that MOB wanted to meet with CSW to receive resources and supports for a fetal demise.  RN reached back out to CSW and reported that MOB is declining to meet with CSW. Per RN, MOB is an established client with and outpatient therapist and MOB feels comfortable seeking help is additional help is needed.  ? ?There are no barriers to discharge.  ? ?Blaine Hamper, MSW, LCSW ?Clinical Social Work ?[phone] ?

## 2022-02-18 ENCOUNTER — Ambulatory Visit: Payer: Medicaid Other

## 2022-02-19 ENCOUNTER — Ambulatory Visit (INDEPENDENT_AMBULATORY_CARE_PROVIDER_SITE_OTHER): Payer: Medicaid Other | Admitting: *Deleted

## 2022-02-19 VITALS — BP 131/87 | HR 85

## 2022-02-19 DIAGNOSIS — Z5189 Encounter for other specified aftercare: Secondary | ICD-10-CM

## 2022-02-19 DIAGNOSIS — O10919 Unspecified pre-existing hypertension complicating pregnancy, unspecified trimester: Secondary | ICD-10-CM

## 2022-02-19 MED ORDER — NIFEDIPINE ER 30 MG PO TB24: 30.0000 mg | ORAL_TABLET | Freq: Every day | ORAL | 0 refills | Status: DC

## 2022-02-19 NOTE — Progress Notes (Addendum)
Subjective:  Taylor Chang is a 26 y.o. female here for BP check and incision check.  Hypertension ROS: taking medications as instructed, no medication side effects noted, home BP monitoring in range of [id number] systolic over [id number] diastolic, no TIA's, no chest pain on exertion, no dyspnea on exertion, and no swelling of ankles.   Pt reports incision healing well  Objective:  LMP 05/26/2021   Appearance alert, well appearing, and in no distress and oriented to person, place, and time. Incision healing well, no significant drainage, no dehiscence, no significant erythema   Assessment:   Blood Pressure today in office well controlled.  Incision healed nicely, no signs of infection, scar looks good  Plan:  Current treatment plan is effective, no change in therapy. Refill RX Procardia sent to get through [facility] appt 03/31/22 per Dr. Jodi Mourning.  Patient was assessed and managed by nursing staff during this encounter. I have reviewed the chart and agree with the documentation and plan. I have also made any necessary editorial changes.  Baltazar Najjar, MD 02/19/2022 4:47 PM

## 2022-03-28 IMAGING — US US OB LIMITED
1 series · 14 of 28 positions shown · non-contrast
Comparison: none

CLINICAL DATA: Vaginal discharge

EXAM:
LIMITED OBSTETRIC ULTRASOUND AND TRANSVAGINAL OBSTETRIC ULTRASOUND

[Series 1: us ob limited · 14 of 32 slices shown]
[im 2/32]
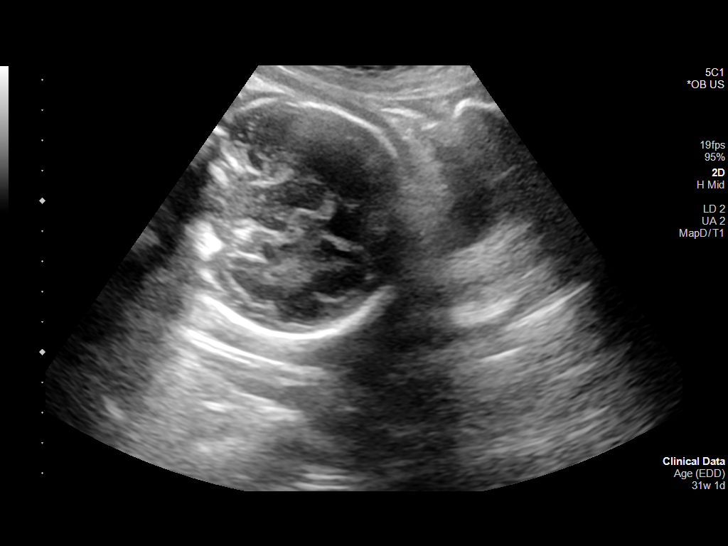
[im 4/32]
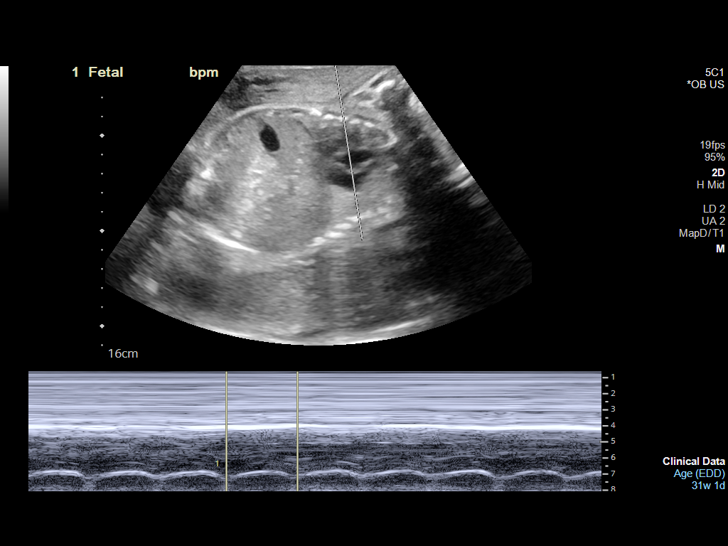
[im 6/32]
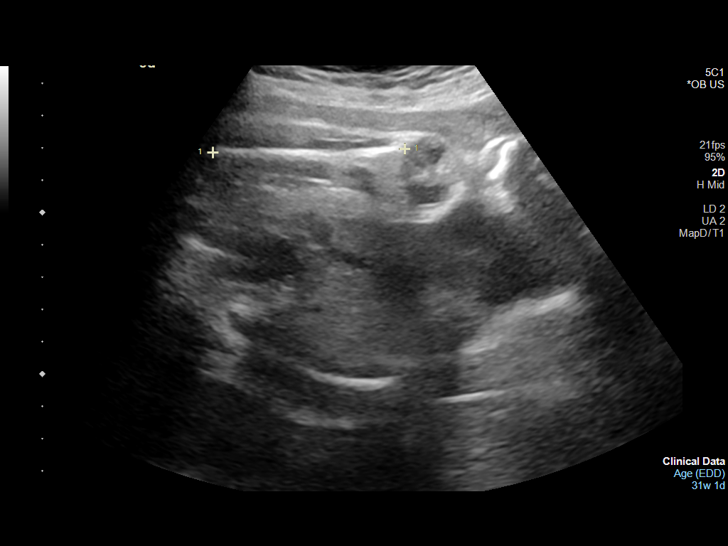
[im 9/32]
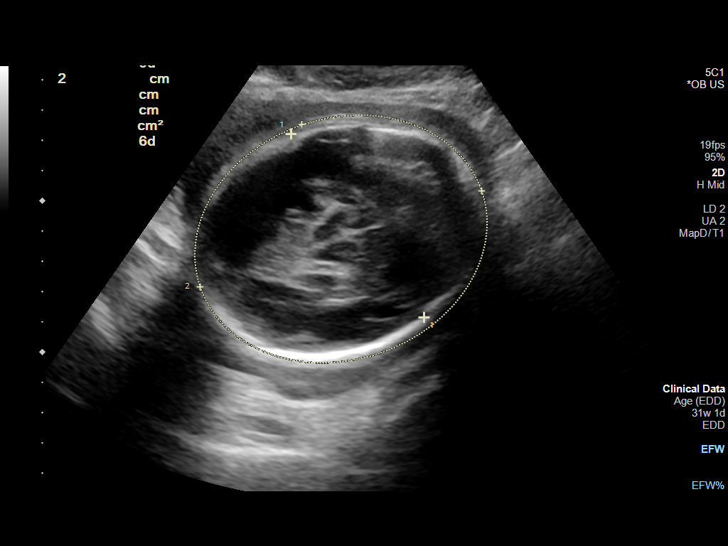
[im 11/32]
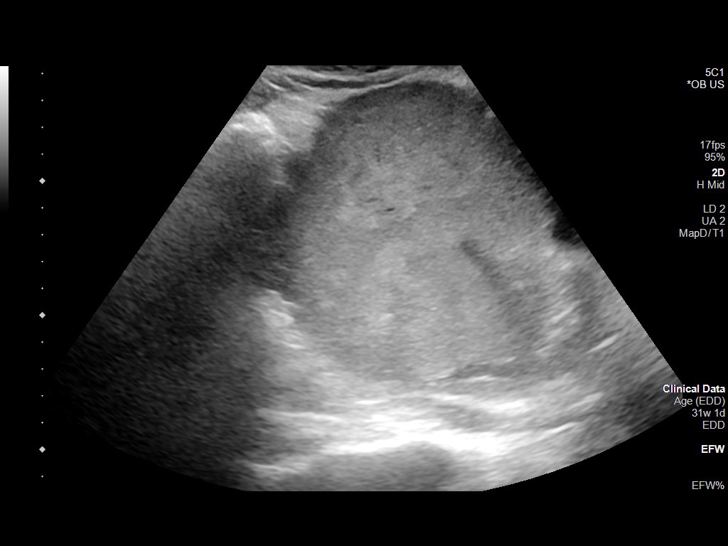
[im 13/32]
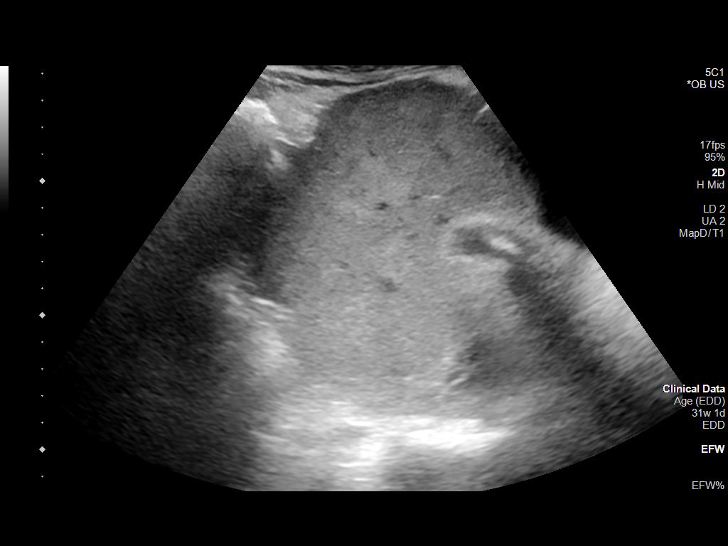
[im 15/32]
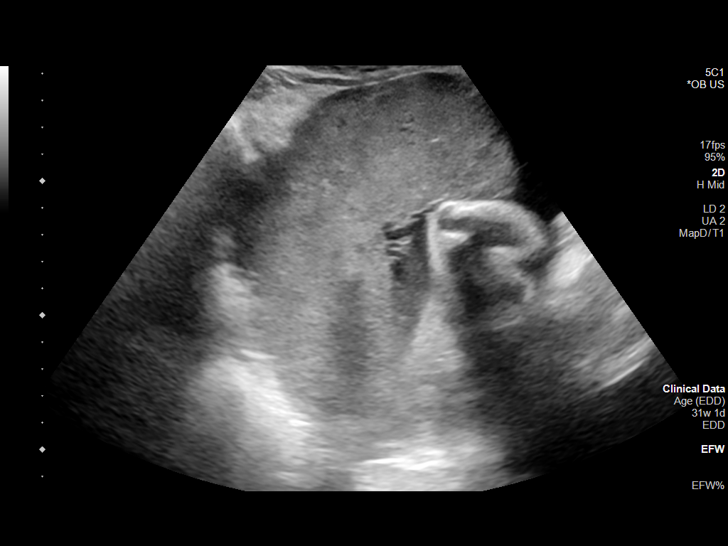
[im 18/32]
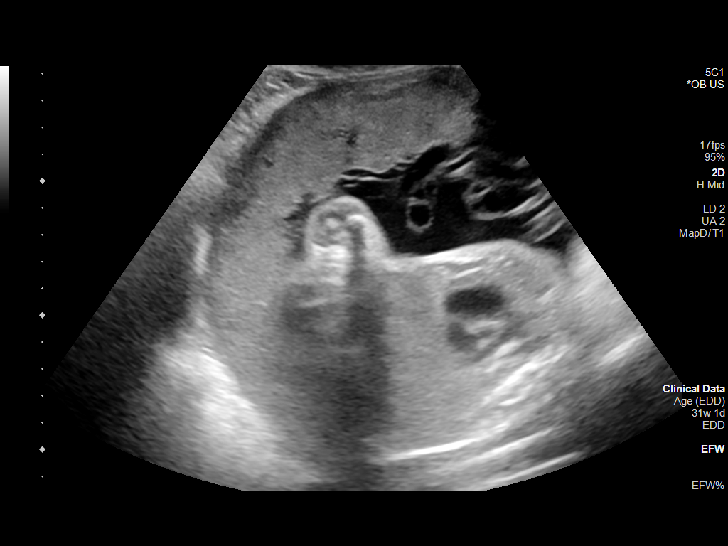
[im 20/32]
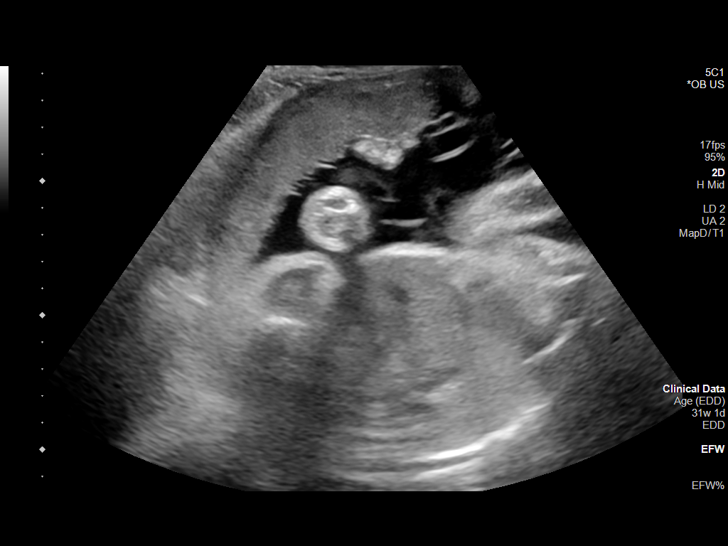
[im 22/32]
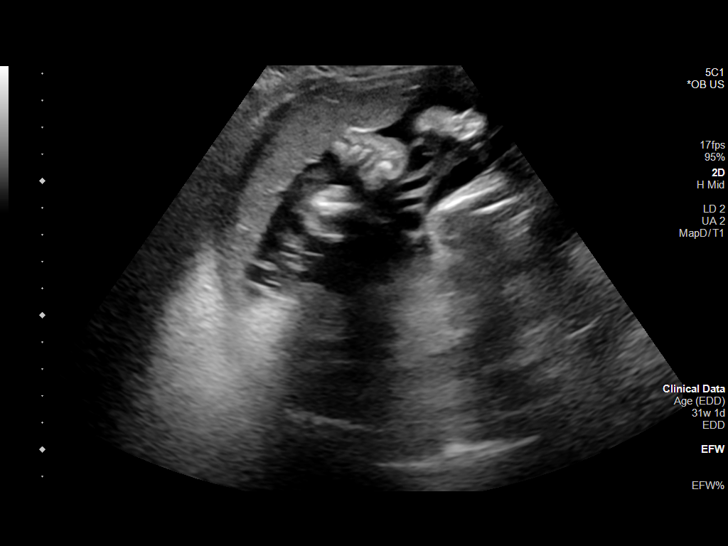
[im 25/32]
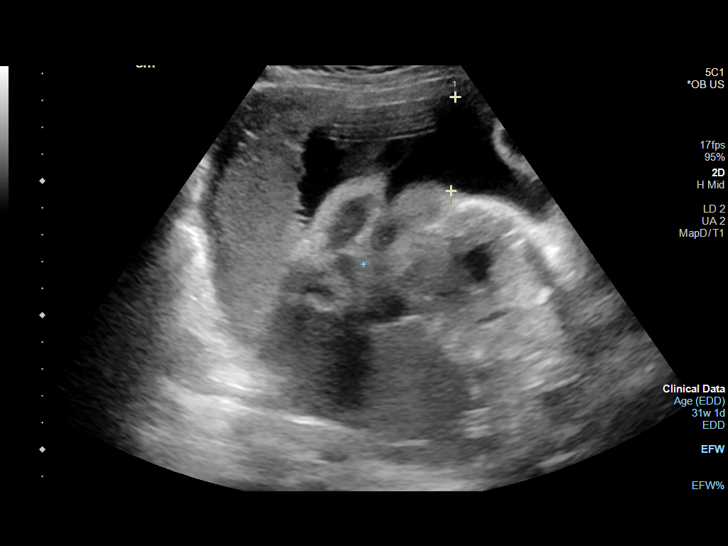
[im 27/32]
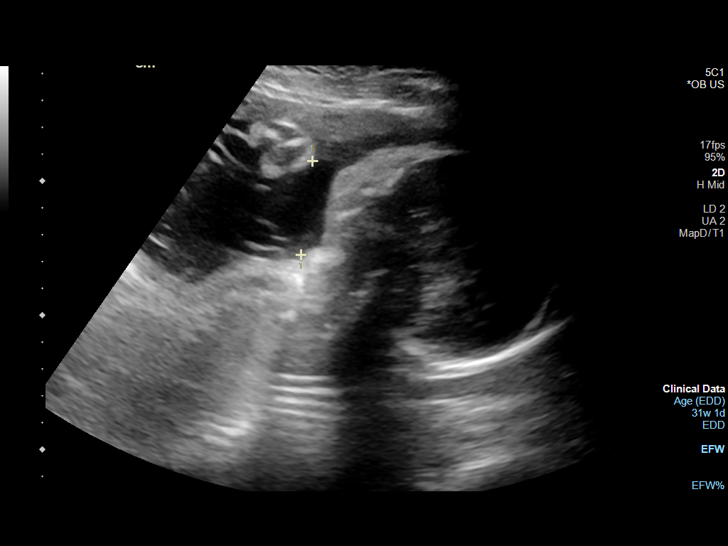
[im 29/32]
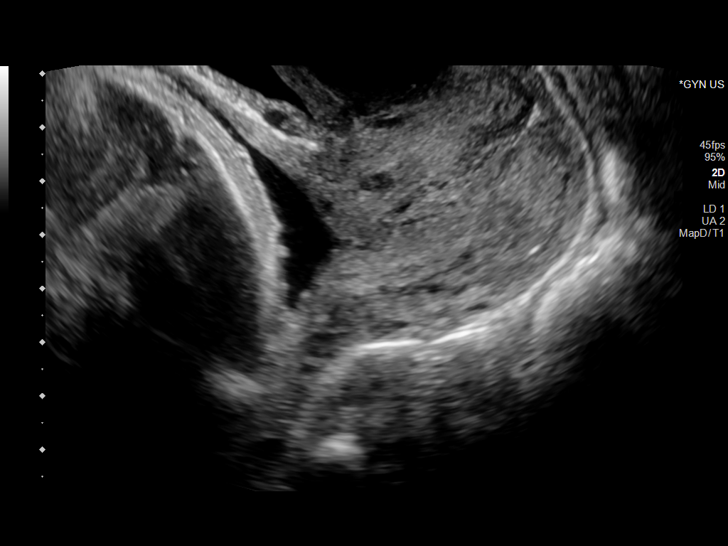
[im 32/32]
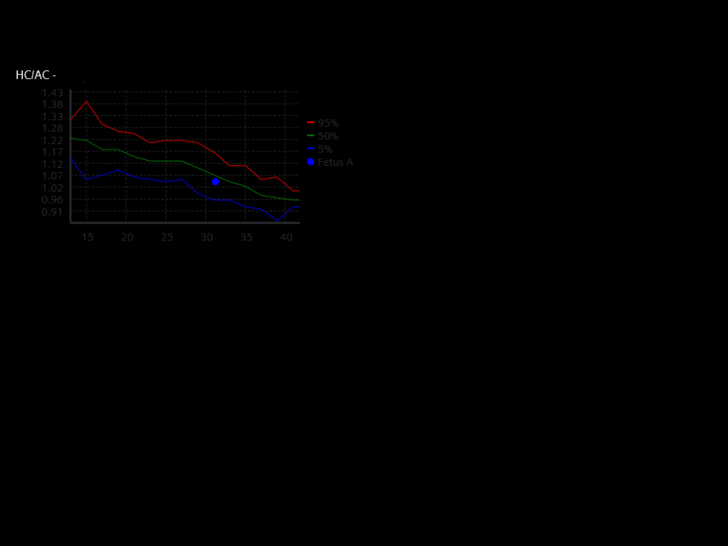

[14 of 28 positions shown; findings below may reference images not displayed]

FINDINGS: Number of Fetuses: 1

Heart Rate:  144 bpm

Movement: Yes

Presentation: Cephalic

Placental Location: Posterior

Previa: No

Amniotic Fluid (Subjective): Within normal limits.

AFI: 13.1 cm

BPD:  7.5cm 30w 0d

MATERNAL FINDINGS:

Cervix:  Appears closed.

Uterus/Adnexae:  No abnormality visualized.
IMPRESSION: Approximately 30 week intrauterine gestation. Fetal heart rate 144
beats per minute.

Amniotic fluid within normal limits.

No acute findings.

This exam is performed on an emergent basis and does not
comprehensively evaluate fetal size, dating, or anatomy; follow-up
complete OB US should be considered if further fetal assessment is
warranted.

## 2022-03-31 ENCOUNTER — Ambulatory Visit: Payer: Medicaid Other | Admitting: Family Medicine

## 2022-04-01 ENCOUNTER — Ambulatory Visit (INDEPENDENT_AMBULATORY_CARE_PROVIDER_SITE_OTHER): Payer: Medicaid Other

## 2022-04-01 DIAGNOSIS — Z3042 Encounter for surveillance of injectable contraceptive: Secondary | ICD-10-CM | POA: Diagnosis not present

## 2022-04-01 LAB — POCT URINE PREGNANCY: Preg Test, Ur: NEGATIVE

## 2022-04-01 NOTE — Progress Notes (Signed)
Pt was advised to schedule appt for depo start due to missing pp visit. Pt is in the office for 1st UPT for depo, results are negative today, Pt has pp rescheduled for 04/23/22, advised if UPT is negative then she can get at pp visit. ?

## 2022-04-15 ENCOUNTER — Ambulatory Visit: Payer: Medicaid Other

## 2022-04-23 ENCOUNTER — Ambulatory Visit (INDEPENDENT_AMBULATORY_CARE_PROVIDER_SITE_OTHER): Payer: Medicaid Other | Admitting: Obstetrics and Gynecology

## 2022-04-23 ENCOUNTER — Encounter: Payer: Self-pay | Admitting: Obstetrics and Gynecology

## 2022-04-23 ENCOUNTER — Other Ambulatory Visit (HOSPITAL_COMMUNITY)
Admission: RE | Admit: 2022-04-23 | Discharge: 2022-04-23 | Disposition: A | Discharge status: Home / Self Care | Payer: Medicaid Other | Source: Ambulatory Visit | Attending: Obstetrics and Gynecology | Admitting: Obstetrics and Gynecology

## 2022-04-23 VITALS — BP 129/74 | HR 84 | Ht 67.0 in | Wt 185.0 lb

## 2022-04-23 DIAGNOSIS — R87619 Unspecified abnormal cytological findings in specimens from cervix uteri: Secondary | ICD-10-CM | POA: Insufficient documentation

## 2022-04-23 DIAGNOSIS — O10919 Unspecified pre-existing hypertension complicating pregnancy, unspecified trimester: Secondary | ICD-10-CM

## 2022-04-23 DIAGNOSIS — Z30013 Encounter for initial prescription of injectable contraceptive: Secondary | ICD-10-CM | POA: Diagnosis not present

## 2022-04-23 DIAGNOSIS — Z309 Encounter for contraceptive management, unspecified: Secondary | ICD-10-CM | POA: Insufficient documentation

## 2022-04-23 LAB — POCT URINE PREGNANCY: Preg Test, Ur: POSITIVE — AB

## 2022-04-23 MED ORDER — MEDROXYPROGESTERONE ACETATE 150 MG/ML IM SUSP: 150.0000 mg | Freq: Once | INTRAMUSCULAR | Status: DC

## 2022-04-23 MED ORDER — MEDROXYPROGESTERONE ACETATE 150 MG/ML IM SUSP: 150.0000 mg | INTRAMUSCULAR | 4 refills | Status: DC

## 2022-04-23 NOTE — Progress Notes (Signed)
Post Partum Visit Note  Taylor Chang is a 26 y.o. [id number] female who presents for a postpartum visit. She is 10 weeks postpartum following a repeat cesarean section.  I have fully reviewed the prenatal and intrapartum course. The delivery was at 37.0 gestational weeks.  Anesthesia: spinal. Postpartum course has been unremarkable. Baby is doing well. Baby is feeding by bottle - Gerber Gentle . Bleeding no bleeding. Bowel function is normal. Bladder function is normal. Patient is sexually active. Contraception method is Depo-Provera injections. Postpartum depression screening: negative.   The pregnancy intention screening data noted above was reviewed. Potential methods of contraception were discussed. The patient elected to proceed with No data recorded.   Edinburgh Postnatal Depression Scale - 04/23/22 1510       Edinburgh Postnatal Depression Scale:  In the Past 7 Days   I have been able to laugh and see the funny side of things. 0    I have looked forward with enjoyment to things. 0    I have blamed myself unnecessarily when things went wrong. 0    I have been anxious or worried for no good reason. 0    I have felt scared or panicky for no good reason. 0    Things have been getting on top of me. 0    I have been so unhappy that I have had difficulty sleeping. 0    I have felt sad or miserable. 0    I have been so unhappy that I have been crying. 0    The thought of harming myself has occurred to me. 0    Edinburgh Postnatal Depression Scale Total 0             Health Maintenance Due  Topic Date Due   COVID-19 Vaccine (1) Never done    The following portions of the patient's history were reviewed and updated as appropriate: allergies, current medications, past family history, past medical history, past social history, past surgical history, and problem list.  Review of Systems Pertinent items noted in HPI and remainder of comprehensive ROS otherwise  negative.  Objective:  BP 129/74   Pulse 84   Ht 5\' 7"  (1.702 m)   Wt 185 lb (83.9 kg)   LMP 03/28/2022 (Approximate)   Breastfeeding No   BMI 28.98 kg/m    General:  alert   Breasts:  not indicated  Lungs: clear to auscultation bilaterally  Heart:  regular rate and rhythm, S1, S2 normal, no murmur, click, rub or gallop  Abdomen: soft, non-tender; bowel sounds normal; no masses,  no organomegaly   Wound NA  GU exam:  normal       Assessment:    There are no diagnoses linked to this encounter.  Nl postpartum exam.  Abnormal pap smear CHTN + urine pregnancy test  Plan:   Essential components of care per ACOG recommendations:  1.  Mood and well being: Patient with negative depression screening today. Reviewed local resources for support.  - Patient tobacco use? No.   - hx of drug use? No.    2. Infant care and feeding:  -Patient currently breastmilk feeding? No.  -Social determinants of health (SDOH) reviewed in EPIC.   3. Sexuality, contraception and birth spacing - Patient does not want a pregnancy in the next year.  Desired family size is Uncertain children.  - Reviewed reproductive life planning. Reviewed contraceptive methods based on pt preferences and effectiveness.  Patient desired No Method - Other  Reason today.   - Discussed birth spacing of 18 months  4. Sleep and fatigue -Encouraged family/partner/community support of 4 hrs of uninterrupted sleep to help with mood and fatigue  5. Physical Recovery  - Discussed patients delivery and complications. She describes her labor as good. - Patient had a C-section, no problems at delivery.  Patient expressed understanding - Patient has urinary incontinence? No. - Patient is safe to resume physical and sexual activity  6.  Health Maintenance - HM due items addressed Yes - Last pap smear  Diagnosis  Date Value Ref Range Status  08/21/2021 - Atypical glandular cells, NOS (A)  Final  08/21/2021 (A)  Final   -  Atypical squamous cells of undetermined significance (ASC-US)   Pap smear done at today's visit.  -Breast Cancer screening indicated? No.   7. Chronic Disease/Pregnancy Condition follow up: Hypertension  Will d/c Procardia and f/u with nurse visit in 2 weeks for BP check  + urine pregnancy test today Reviewed with pt. Pt uncertain as to if she will continue with pregnancy  Nettie Elm, MD [facility] [employer], [facility]

## 2022-04-23 NOTE — Patient Instructions (Signed)

## 2022-04-27 LAB — CYTOLOGY - PAP

## 2022-05-01 IMAGING — US US MFM OB FOLLOW-UP
1 series · 14 of 28 positions shown · non-contrast
Comparison: none

[Series 1: us mfm ob follow-up · 34 acquisitions, 14 frames shown]
[im 2/34]
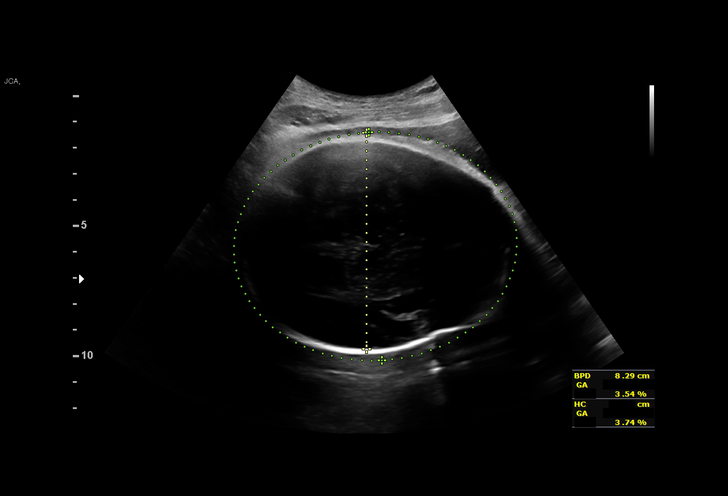
[im 4/34]
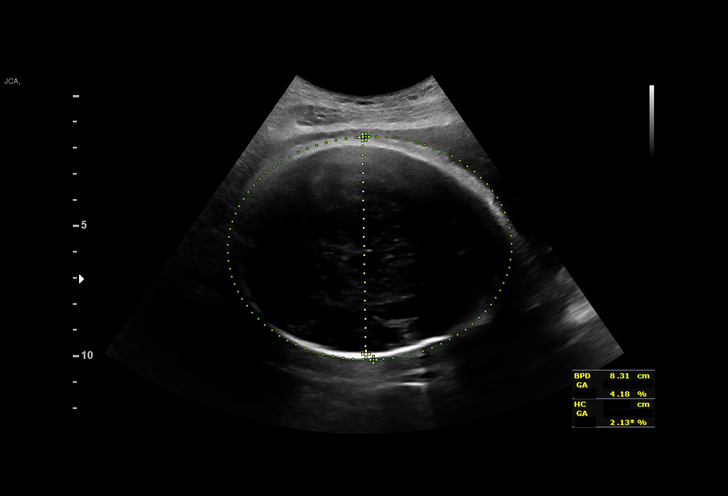
[im 7/34]
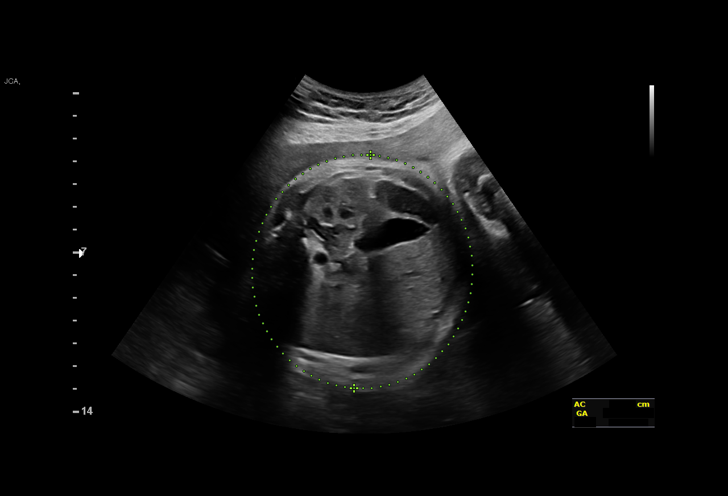
[im 9/34]
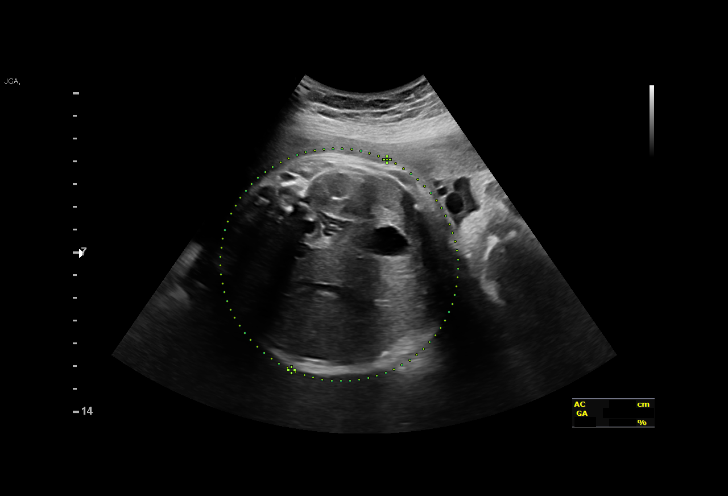
[im 12/34]
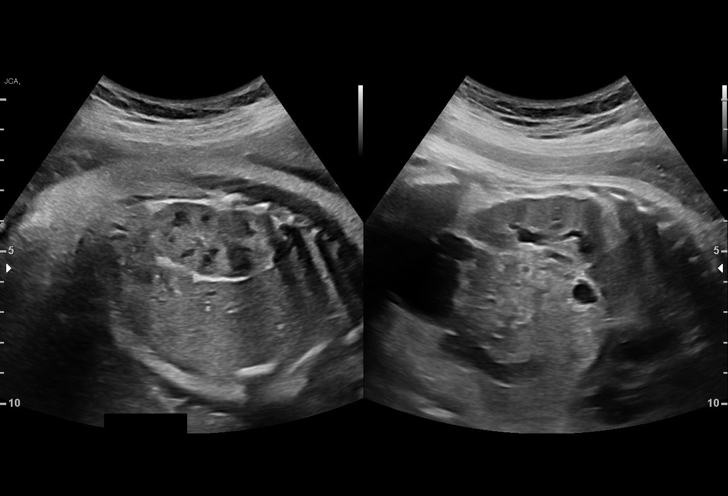
[im 14/34]
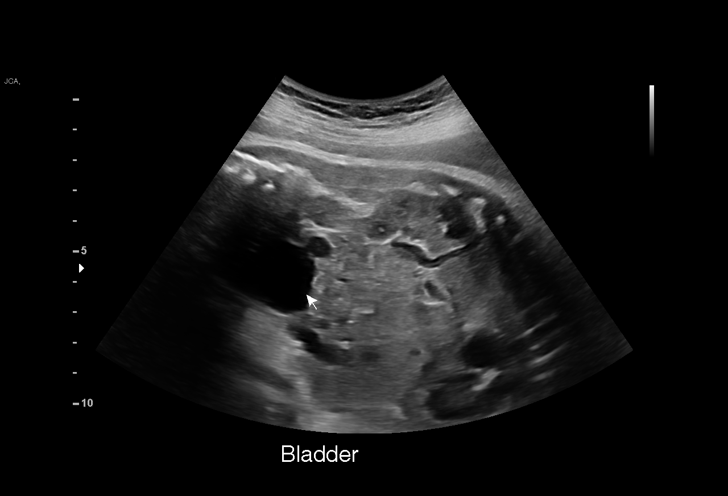
[im 16/34]
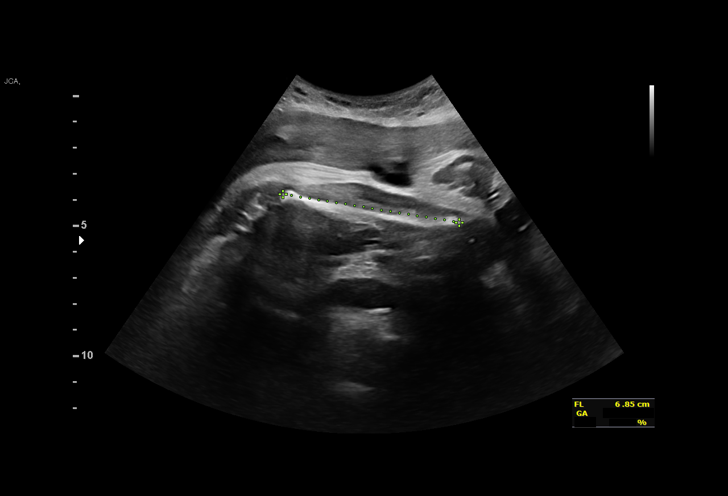
[im 19/34]
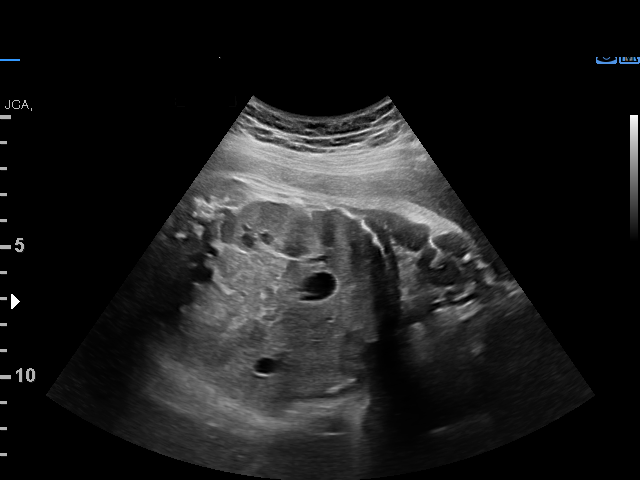
[im 21/34]
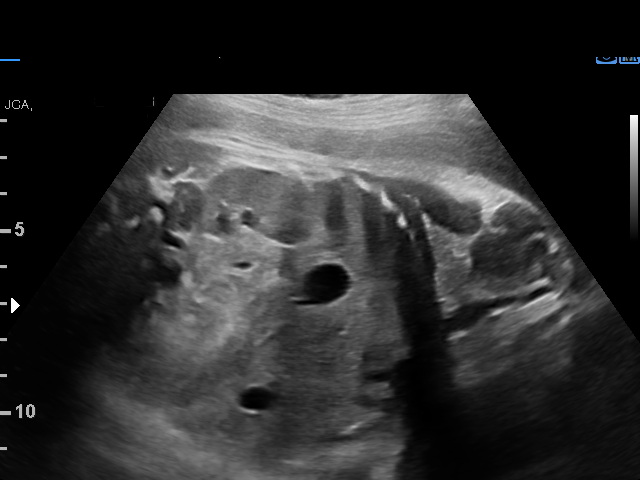
[im 24/34]
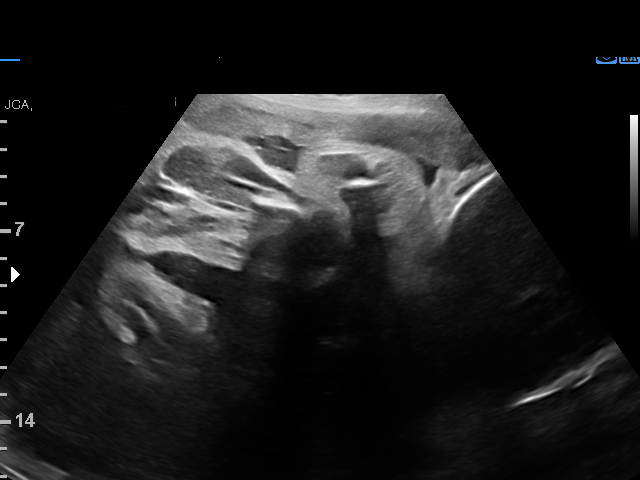
[im 26/34]
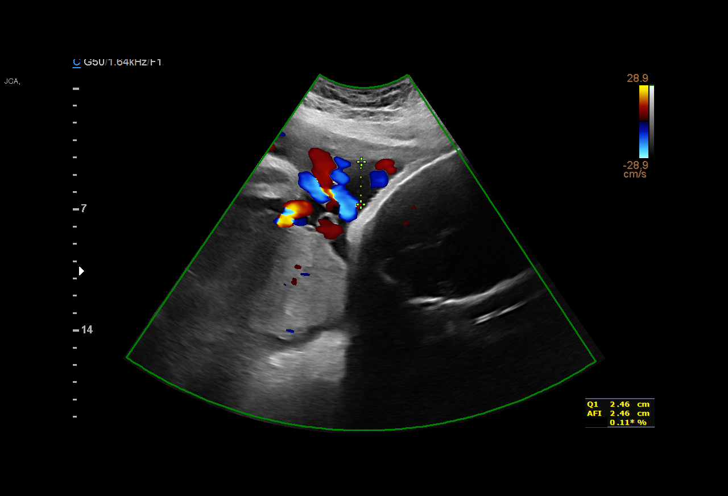
[im 29/34]
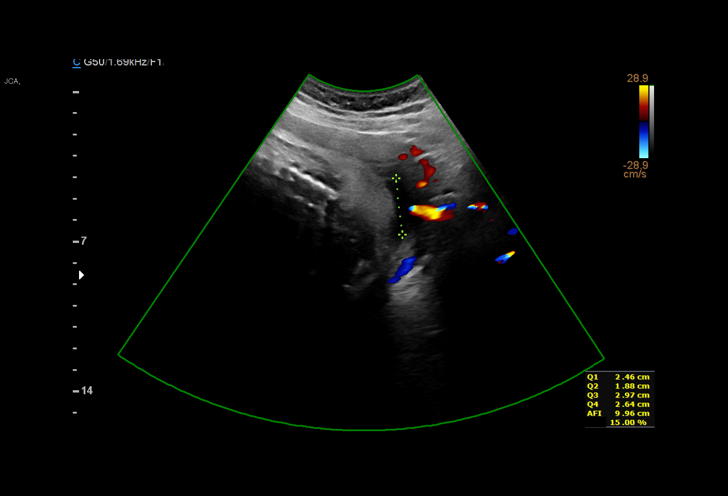
[im 31/34]
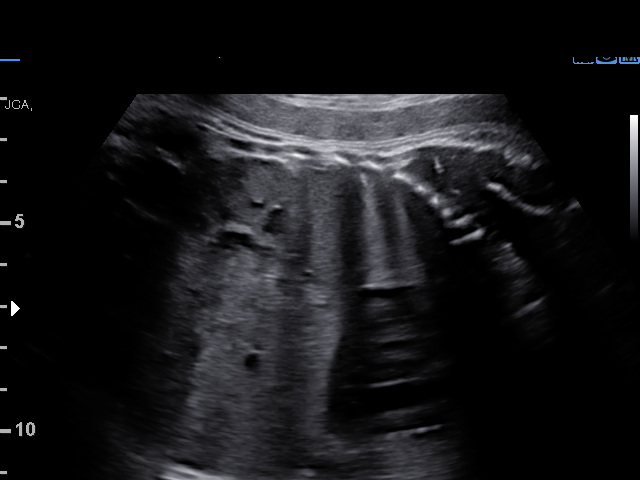
[im 34/34]
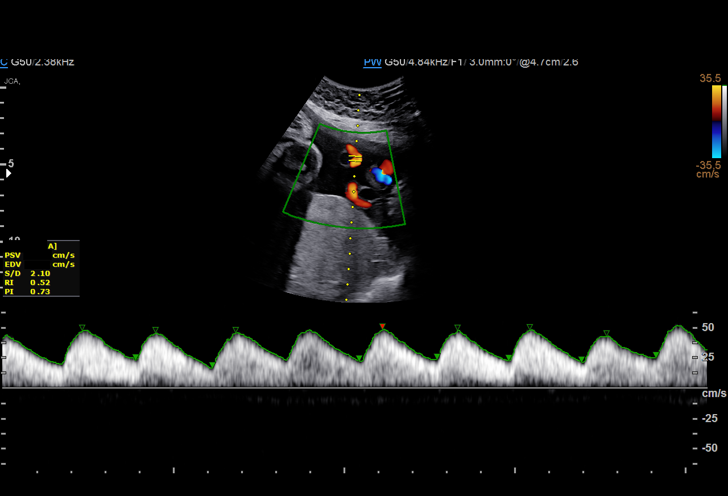

[14 of 28 positions shown; findings below may reference images not displayed]

[id number]

Indications

 Hypertension - Chronic/Pre-existing
 Poor obstetric history: Previous IUFD
 (stillbirth)
 Encounter for other antenatal screening
 follow-up
 36 weeks gestation of pregnancy
Fetal Evaluation

 Num Of Fetuses:         1
 Fetal Heart Rate(bpm):  136
 Cardiac Activity:       Observed
 Presentation:           Cephalic
 Placenta:               Posterior
 P. Cord Insertion:      Previously Visualized

 Amniotic Fluid
 AFI FV:      Within normal limits

 AFI Sum(cm)     %Tile       Largest Pocket(cm)
 9.95            22
 RUQ(cm)       RLQ(cm)       LUQ(cm)        LLQ(cm)

Biophysical Evaluation

 Amniotic F.V:   Within normal limits       F. Tone:        Observed
 F. Movement:    Observed                   Score:          [DATE]
 F. Breathing:   Observed
Biometry

 BPD:      82.6  mm     G. Age:  33w 2d        2.8  %    CI:        72.36   %    70 - 86
                                                         FL/HC:      22.4   %    20.1 -
 HC:      308.9  mm     G. Age:  34w 3d        2.8  %    HC/AC:      0.97        0.93 -
 AC:       320   mm     G. Age:  35w 6d         58  %    FL/BPD:     83.7   %    71 - 87
 FL:       69.1  mm     G. Age:  35w 3d         31  %    FL/AC:      21.6   %    20 - 24

 Est. FW:    0343  gm    5 lb 14 oz      33  %
OB History

 Blood Type:   O-
 Gravidity:    6         Term:   3        Prem:   0        SAB:   1
 TOP:          1       Ectopic:  0        Living: 2
Gestational Age

 LMP:           36w 0d        Date:  05/26/21                 EDD:   03/02/22
 U/S Today:     34w 5d                                        EDD:   03/11/22
 Best:          36w 0d     Det. By:  LMP  (05/26/21)          EDD:   03/02/22
Anatomy

 Cranium:               Appears normal         LVOT:                   Previously seen
 Cavum:                 Previously seen        Aortic Arch:            Previously seen
 Ventricles:            Appears normal         Ductal Arch:            Previously seen
 Choroid Plexus:        Previously seen        Diaphragm:              Appears normal
 Cerebellum:            Previously seen        Stomach:                Appears normal, left
                                                                       sided
 Posterior Fossa:       Previously seen        Abdomen:                Previously seen
 Nuchal Fold:           Not applicable (>20    Abdominal Wall:         Previously seen
                        wks GA)
 Face:                  Orbits and profile     Cord Vessels:           Previously seen
                        previously seen
 Lips:                  Previously seen        Kidneys:                Appear normal
 Palate:                Not well visualized    Bladder:                Appears normal
 Thoracic:              Appears normal         Spine:                  Previously seen
 Heart:                 Previously seen        Upper Extremities:      Previously seen
 RVOT:                  Previously seen        Lower Extremities:      Previously seen

 Other:  Fetus previously seen to be female. Heels/feet, open hands/5th digits,
         VC, 3VV and 3VTV, Nasal bone previously seen. Technically difficult
         due to fetal position.
Comments

 This patient was seen for a BPP and growth scan due to
 chronic hypertension treated with labetalol and a prior fetal
 demise at 37 weeks.  She denies any problems since her last
 exam.
 The fetal growth (EFW 5 pounds 14 ounces, 33rd percentile)
 and amniotic fluid level appeared appropriate for her
 gestational age today.
 A biophysical profile performed today was [DATE].
 She already has a cesarean delivery scheduled on February 09, 2022 (next week).
 No further exams were scheduled in our office.  The

## 2022-05-06 ENCOUNTER — Encounter: Payer: Self-pay | Admitting: Obstetrics and Gynecology

## 2022-05-06 IMAGING — US US RENAL
1 series · 15 of 25 positions shown · non-contrast
Comparison: None.

CLINICAL DATA: Elevated creatinine.

EXAM:
RENAL / URINARY TRACT ULTRASOUND COMPLETE

[Series 1: us renal · 15 of 36 slices shown]
[im 1/36]
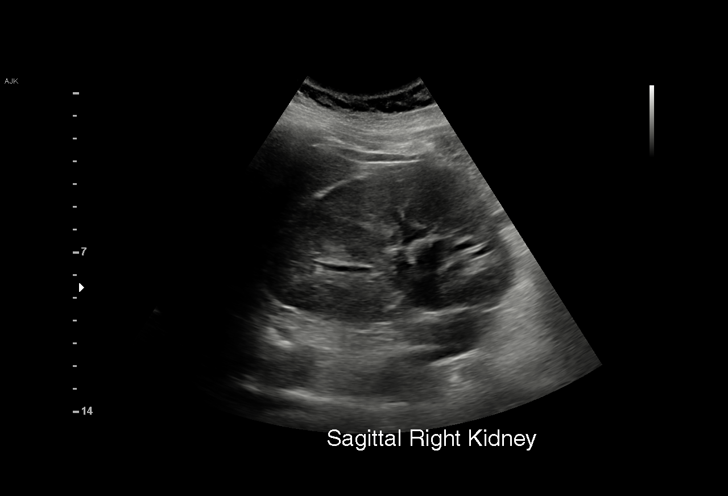
[im 3/36]
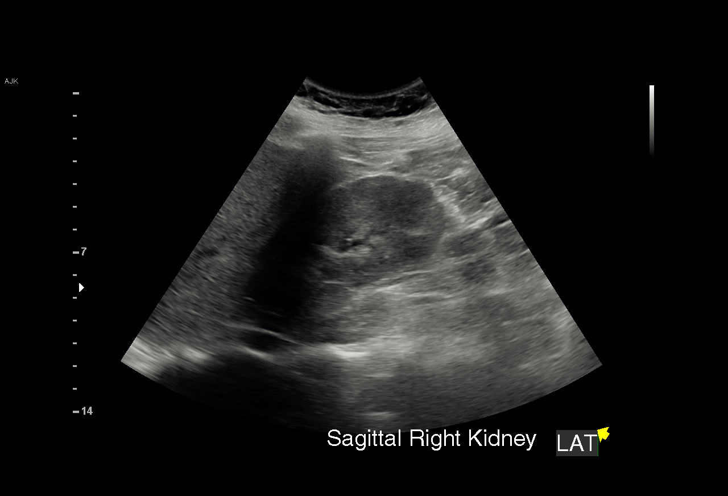
[im 6/36]
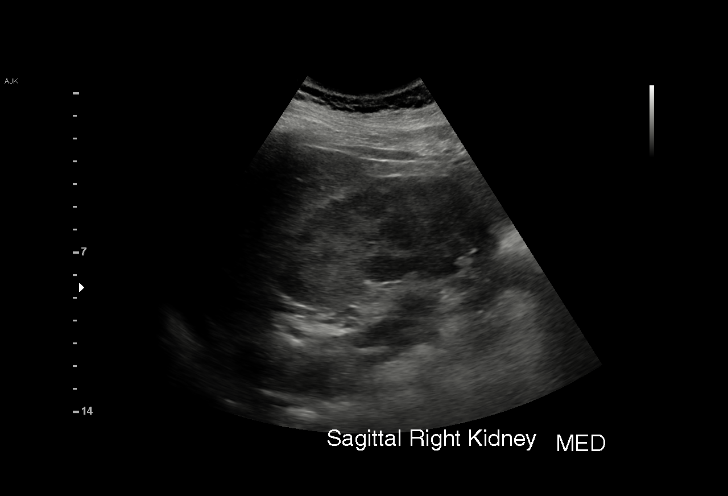
[im 8/36]
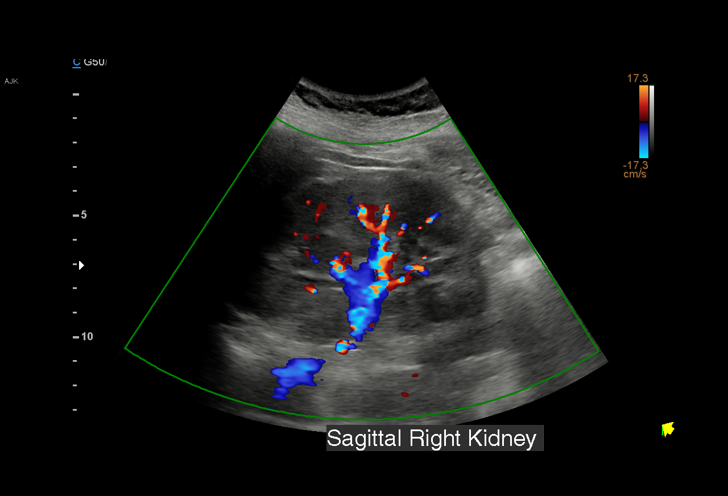
[im 11/36]
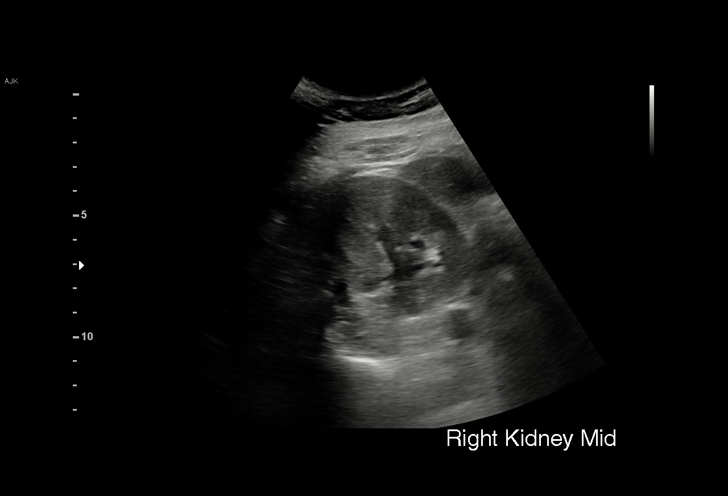
[im 14/36]
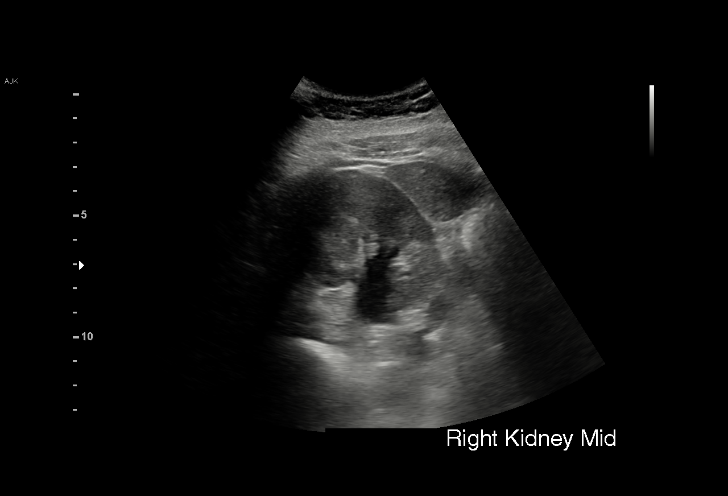
[im 15/36]
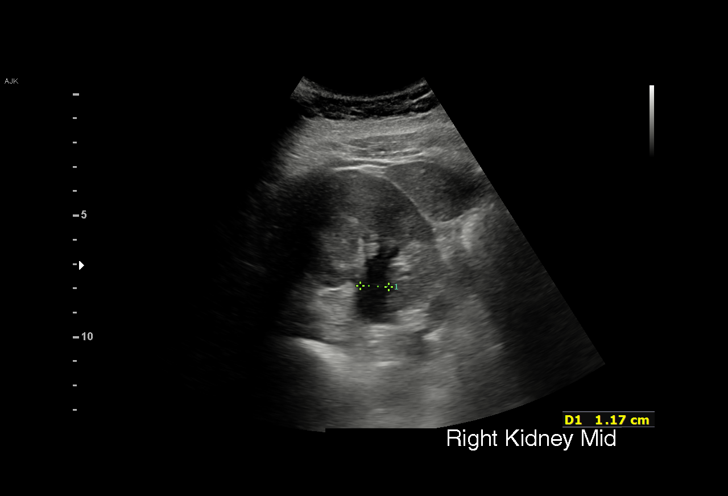
[im 18/36]
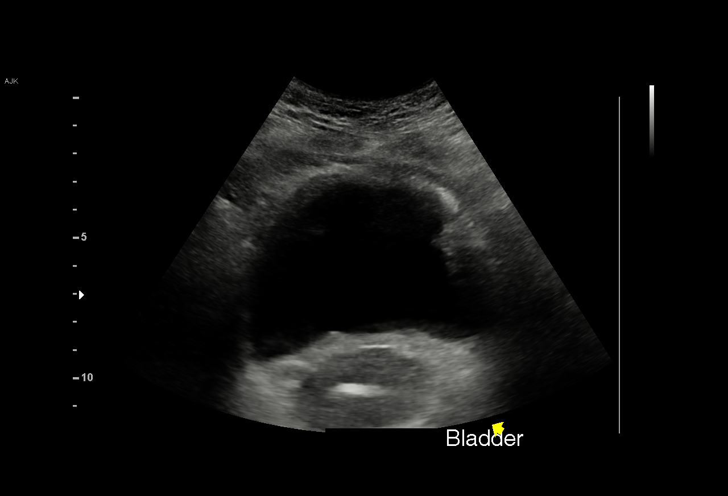
[im 21/36]
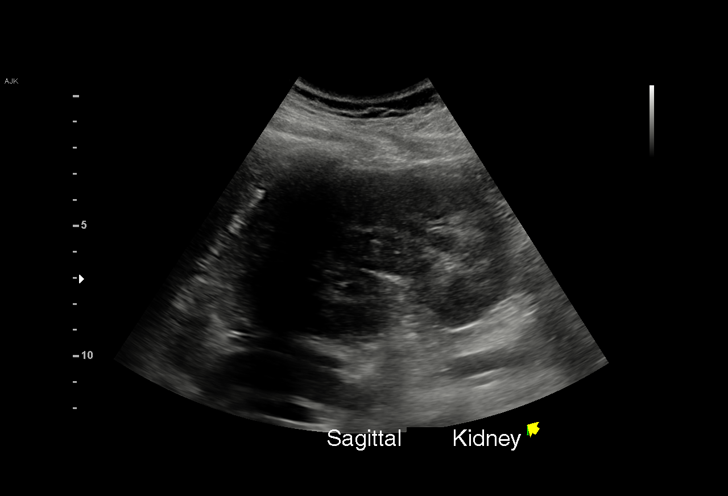
[im 22/36]
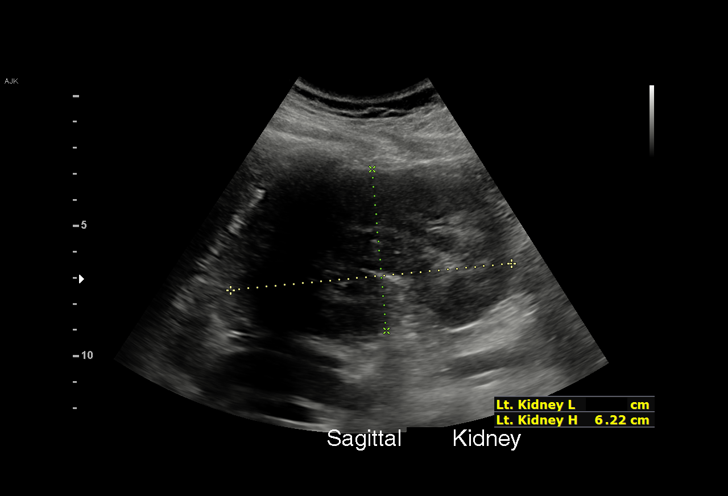
[im 25/36]
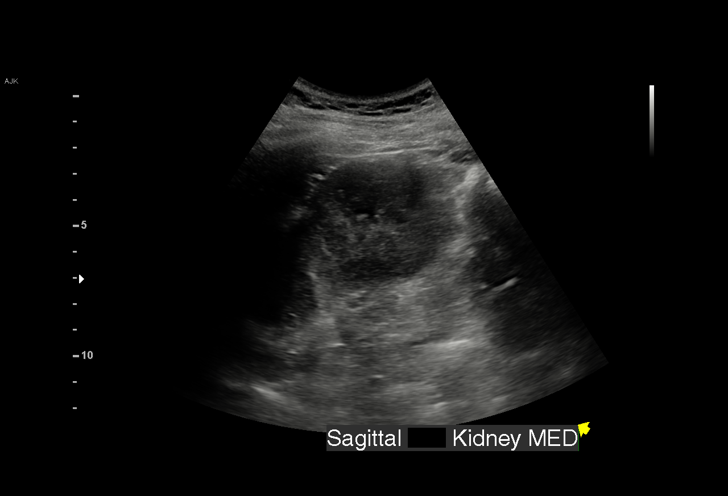
[im 28/36]
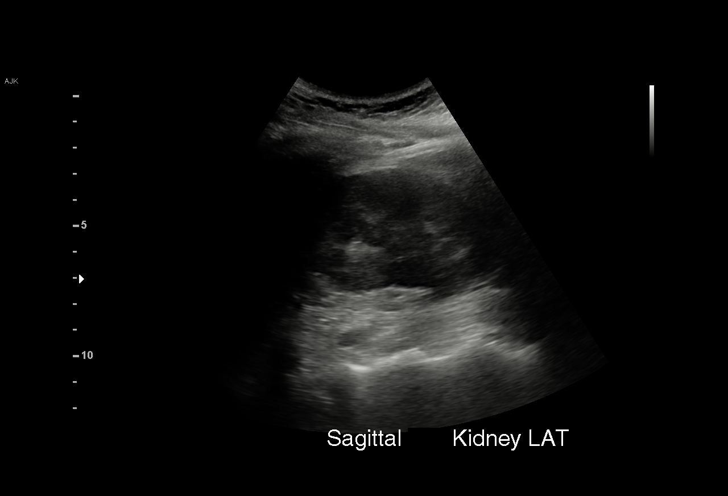
[im 30/36]
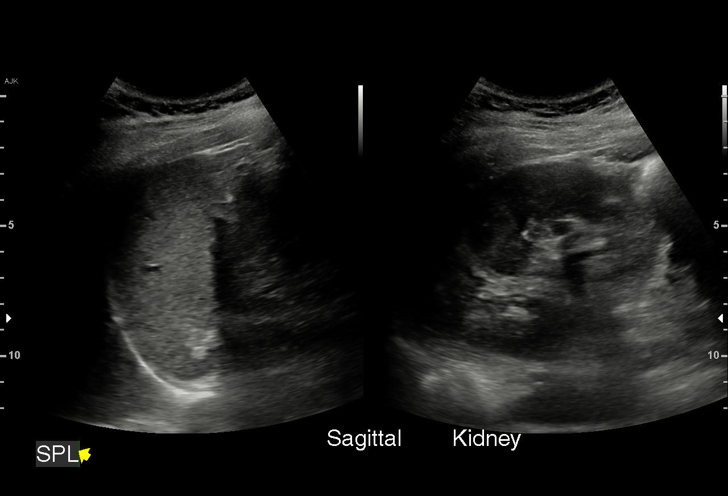
[im 33/36]
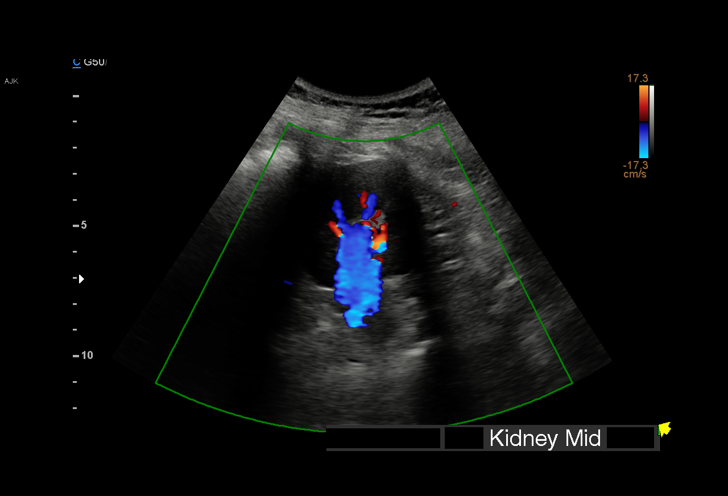
[im 36/36]
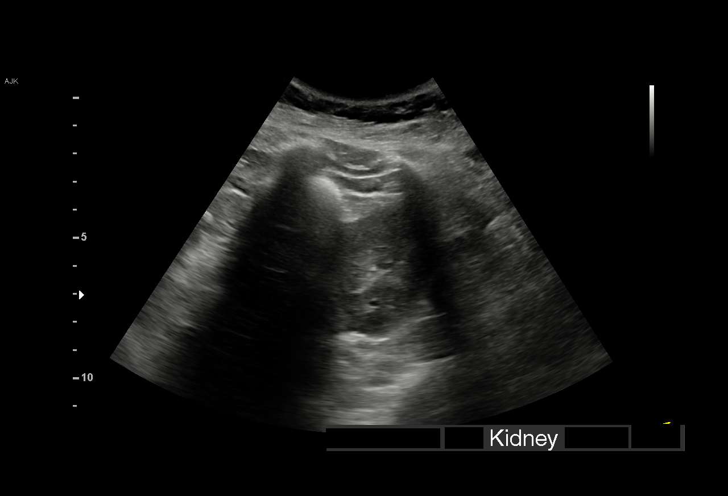

[15 of 25 positions shown; findings below may reference images not displayed]

FINDINGS: Right Kidney:

Renal measurements: 11.3 cm x 6.6 cm x 4.6 cm = volume: 179.0 mL.
Echogenicity within normal limits. There is mild hydronephrosis. No
mass is visualized.

Left Kidney:

Renal measurements: 10.8 cm x 6.2 cm x 4.7 cm = volume: 166.0 mL.
Echogenicity within normal limits. No mass or hydronephrosis
visualized.

Bladder:

Appears normal for degree of bladder distention. Bilateral ureteral
jets are seen.

Other:

None.
IMPRESSION: Mild right-sided hydronephrosis.

## 2022-05-07 ENCOUNTER — Ambulatory Visit: Payer: Medicaid Other

## 2022-06-01 ENCOUNTER — Ambulatory Visit (INDEPENDENT_AMBULATORY_CARE_PROVIDER_SITE_OTHER): Payer: Medicaid Other

## 2022-06-01 VITALS — BP 136/86 | HR 90

## 2022-06-01 DIAGNOSIS — Z0130 Encounter for examination of blood pressure without abnormal findings: Secondary | ICD-10-CM

## 2022-06-01 MED ORDER — NIFEDIPINE ER OSMOTIC RELEASE 30 MG PO TB24: 30.0000 mg | ORAL_TABLET | Freq: Every day | ORAL | 0 refills | Status: DC

## 2022-06-01 NOTE — Progress Notes (Signed)
..  Subjective:  Taylor Chang is a 26 y.o. female here for BP check. Pt reports that she is currently an estimated [redacted] weeks pregnant and attempted to get an abortion last week. Pt reports that her BP was elevated at 141/1010 at her appt so the clinic advised her to follow up with [place] for BP management before the procedure. Pt reports not taking any BP medications at this time.  Hypertension ROS: no TIA's, no chest pain on exertion, no dyspnea on exertion, and no swelling of ankles.    Objective:  BP 136/86   Pulse 90   LMP 03/28/2022 (Approximate)   Appearance alert, well appearing, and in no distress. General exam BP noted to be well controlled today in office.    Assessment:   Blood Pressure stable.   Plan:  Consulted with in office provider and patient will start Procardia 30mg  Daily and follow up in 1 week for BP check. 

## 2022-06-08 ENCOUNTER — Ambulatory Visit: Payer: Medicaid Other

## 2022-06-26 ENCOUNTER — Ambulatory Visit: Payer: Medicaid Other | Admitting: Obstetrics and Gynecology

## 2022-06-26 ENCOUNTER — Encounter: Payer: Self-pay | Admitting: Obstetrics and Gynecology

## 2022-06-26 VITALS — BP 137/91 | HR 81 | Ht 66.0 in | Wt 180.5 lb

## 2022-09-01 ENCOUNTER — Other Ambulatory Visit (HOSPITAL_COMMUNITY)
Admission: RE | Admit: 2022-09-01 | Discharge: 2022-09-01 | Disposition: A | Discharge status: Home / Self Care | Payer: Medicaid Other | Source: Ambulatory Visit | Attending: Obstetrics and Gynecology | Admitting: Obstetrics and Gynecology

## 2022-09-01 ENCOUNTER — Ambulatory Visit (INDEPENDENT_AMBULATORY_CARE_PROVIDER_SITE_OTHER): Payer: Medicaid Other | Admitting: Obstetrics and Gynecology

## 2022-09-01 ENCOUNTER — Encounter: Payer: Self-pay | Admitting: Obstetrics and Gynecology

## 2022-09-01 VITALS — BP 135/88 | HR 80 | Ht 66.0 in | Wt 170.0 lb

## 2022-09-01 DIAGNOSIS — R87619 Unspecified abnormal cytological findings in specimens from cervix uteri: Secondary | ICD-10-CM | POA: Diagnosis present

## 2022-09-01 DIAGNOSIS — Z01812 Encounter for preprocedural laboratory examination: Secondary | ICD-10-CM

## 2022-09-01 NOTE — Progress Notes (Signed)
26 yo P3 with AGUS- NOS on 04/2022 pap smear here for colposcopy with endometrial biopsy. Patient had a similar finding in 2022.   Patient given informed consent, signed copy in the chart, time out was performed.  Placed in lithotomy position. Cervix viewed with speculum and colposcope after application of acetic acid.   Colposcopy adequate?  yes [place]etowhite lesions?no Punctation?no Mosaicism?  no Abnormal vasculature?  no Biopsies?no ECC?yes   ENDOMETRIAL BIOPSY     The indications for endometrial biopsy were reviewed.   Risks of the biopsy including cramping, bleeding, infection, uterine perforation, inadequate specimen and need for additional procedures  were discussed. The patient states she understands and agrees to undergo procedure today. Consent was signed. Time out was performed. Urine HCG was negative. A sterile speculum was placed in the patient's vagina and the cervix was prepped with Betadine. A single-toothed tenaculum was placed on the anterior lip of the cervix to stabilize it. The uterine cavity was sounded to a depth of 8 cm using the uterine sound. The 3 mm pipelle was introduced into the endometrial cavity without difficulty, 2 passes were made.  A  moderate amount of tissue was  sent to pathology. The instruments were removed from the patient's vagina. Minimal bleeding from the cervix was noted. The patient tolerated the procedure well.    COMMENTS: Patient was given post procedure instructions.  She will return in 2 weeks for results. Discussed benefits of Gardasil vaccine and patient is considering it  Mora Bellman, MD

## 2022-09-01 NOTE — Progress Notes (Signed)
For EMB and ECC, Abnormal Pap Atypical glandular cells

## 2022-09-02 LAB — SURGICAL PATHOLOGY

## 2022-09-17 ENCOUNTER — Other Ambulatory Visit: Payer: Self-pay | Admitting: General Practice

## 2022-09-17 MED ORDER — NIFEDIPINE ER OSMOTIC RELEASE 30 MG PO TB24: 30.0000 mg | ORAL_TABLET | Freq: Every day | ORAL | 1 refills | Status: AC

## 2022-09-17 NOTE — Progress Notes (Signed)
Received Rx refill request from pharmacy. Pt states she needs refill. Rx sent with 1 refill. Pt told to f/u with PCP to manage blood pressure.

## 2023-07-27 ENCOUNTER — Other Ambulatory Visit (HOSPITAL_BASED_OUTPATIENT_CLINIC_OR_DEPARTMENT_OTHER): Payer: Self-pay

## 2023-07-27 DIAGNOSIS — R0683 Snoring: Secondary | ICD-10-CM
